# Patient Record
Sex: Female | Born: 1935 | Race: White | Hispanic: No | Marital: Married | Smoking: Never smoker
Health system: Southern US, Community
[De-identification: ages and names within clinical notes are randomized; demographics above are authoritative.]

## PROBLEM LIST (undated history)

## (undated) DIAGNOSIS — I2510 Atherosclerotic heart disease of native coronary artery without angina pectoris: Secondary | ICD-10-CM

## (undated) DIAGNOSIS — I4891 Unspecified atrial fibrillation: Secondary | ICD-10-CM

## (undated) DIAGNOSIS — E785 Hyperlipidemia, unspecified: Secondary | ICD-10-CM

## (undated) DIAGNOSIS — I441 Atrioventricular block, second degree: Secondary | ICD-10-CM

## (undated) DIAGNOSIS — I10 Essential (primary) hypertension: Secondary | ICD-10-CM

## (undated) DIAGNOSIS — M810 Age-related osteoporosis without current pathological fracture: Secondary | ICD-10-CM

## (undated) DIAGNOSIS — M1990 Unspecified osteoarthritis, unspecified site: Secondary | ICD-10-CM

## (undated) HISTORY — PX: BLADDER SUSPENSION: SHX72

## (undated) HISTORY — PX: CORONARY ARTERY BYPASS GRAFT: SHX141

## (undated) HISTORY — PX: CHOLECYSTECTOMY: SHX55

## (undated) HISTORY — PX: RECTAL PROLAPSE REPAIR: SHX759

---

## 1997-12-11 ENCOUNTER — Inpatient Hospital Stay (HOSPITAL_COMMUNITY): Admission: AD | Admit: 1997-12-11 | Discharge: 1997-12-19 | Payer: Self-pay | Admitting: *Deleted

## 2002-06-29 ENCOUNTER — Emergency Department (HOSPITAL_COMMUNITY): Admission: EM | Admit: 2002-06-29 | Discharge: 2002-06-29 | Payer: Self-pay | Admitting: Internal Medicine

## 2002-06-29 ENCOUNTER — Encounter: Payer: Self-pay | Admitting: Internal Medicine

## 2005-12-27 ENCOUNTER — Ambulatory Visit: Payer: Self-pay | Admitting: Cardiology

## 2013-04-07 ENCOUNTER — Inpatient Hospital Stay (HOSPITAL_COMMUNITY)
Admission: AD | Admit: 2013-04-07 | Discharge: 2013-04-10 | DRG: 243 | Disposition: A | Discharge status: Home / Self Care | Payer: Medicare Other | Source: Other Acute Inpatient Hospital | Attending: Cardiology | Admitting: Cardiology

## 2013-04-07 ENCOUNTER — Encounter (HOSPITAL_COMMUNITY): Payer: Self-pay | Admitting: Acute Care

## 2013-04-07 DIAGNOSIS — Z800 Family history of malignant neoplasm of digestive organs: Secondary | ICD-10-CM

## 2013-04-07 DIAGNOSIS — N179 Acute kidney failure, unspecified: Secondary | ICD-10-CM

## 2013-04-07 DIAGNOSIS — I25810 Atherosclerosis of coronary artery bypass graft(s) without angina pectoris: Secondary | ICD-10-CM

## 2013-04-07 DIAGNOSIS — R29898 Other symptoms and signs involving the musculoskeletal system: Secondary | ICD-10-CM | POA: Diagnosis not present

## 2013-04-07 DIAGNOSIS — Z7901 Long term (current) use of anticoagulants: Secondary | ICD-10-CM

## 2013-04-07 DIAGNOSIS — Z9089 Acquired absence of other organs: Secondary | ICD-10-CM

## 2013-04-07 DIAGNOSIS — I498 Other specified cardiac arrhythmias: Secondary | ICD-10-CM | POA: Diagnosis present

## 2013-04-07 DIAGNOSIS — I4891 Unspecified atrial fibrillation: Secondary | ICD-10-CM | POA: Diagnosis present

## 2013-04-07 DIAGNOSIS — D649 Anemia, unspecified: Secondary | ICD-10-CM

## 2013-04-07 DIAGNOSIS — R209 Unspecified disturbances of skin sensation: Secondary | ICD-10-CM | POA: Diagnosis not present

## 2013-04-07 DIAGNOSIS — Z823 Family history of stroke: Secondary | ICD-10-CM

## 2013-04-07 DIAGNOSIS — Z951 Presence of aortocoronary bypass graft: Secondary | ICD-10-CM

## 2013-04-07 DIAGNOSIS — I442 Atrioventricular block, complete: Principal | ICD-10-CM | POA: Diagnosis present

## 2013-04-07 DIAGNOSIS — Z602 Problems related to living alone: Secondary | ICD-10-CM

## 2013-04-07 DIAGNOSIS — I129 Hypertensive chronic kidney disease with stage 1 through stage 4 chronic kidney disease, or unspecified chronic kidney disease: Secondary | ICD-10-CM | POA: Diagnosis present

## 2013-04-07 DIAGNOSIS — D696 Thrombocytopenia, unspecified: Secondary | ICD-10-CM | POA: Diagnosis present

## 2013-04-07 DIAGNOSIS — I441 Atrioventricular block, second degree: Secondary | ICD-10-CM | POA: Insufficient documentation

## 2013-04-07 DIAGNOSIS — I10 Essential (primary) hypertension: Secondary | ICD-10-CM | POA: Diagnosis present

## 2013-04-07 DIAGNOSIS — Z833 Family history of diabetes mellitus: Secondary | ICD-10-CM

## 2013-04-07 DIAGNOSIS — M810 Age-related osteoporosis without current pathological fracture: Secondary | ICD-10-CM | POA: Diagnosis present

## 2013-04-07 DIAGNOSIS — Z79899 Other long term (current) drug therapy: Secondary | ICD-10-CM

## 2013-04-07 DIAGNOSIS — E875 Hyperkalemia: Secondary | ICD-10-CM | POA: Diagnosis present

## 2013-04-07 DIAGNOSIS — I4892 Unspecified atrial flutter: Secondary | ICD-10-CM | POA: Diagnosis present

## 2013-04-07 DIAGNOSIS — I2510 Atherosclerotic heart disease of native coronary artery without angina pectoris: Secondary | ICD-10-CM | POA: Diagnosis present

## 2013-04-07 DIAGNOSIS — N183 Chronic kidney disease, stage 3 unspecified: Secondary | ICD-10-CM

## 2013-04-07 DIAGNOSIS — E785 Hyperlipidemia, unspecified: Secondary | ICD-10-CM | POA: Diagnosis present

## 2013-04-07 DIAGNOSIS — H9190 Unspecified hearing loss, unspecified ear: Secondary | ICD-10-CM | POA: Diagnosis present

## 2013-04-07 DIAGNOSIS — M1990 Unspecified osteoarthritis, unspecified site: Secondary | ICD-10-CM | POA: Diagnosis present

## 2013-04-07 HISTORY — DX: Age-related osteoporosis without current pathological fracture: M81.0

## 2013-04-07 HISTORY — DX: Unspecified osteoarthritis, unspecified site: M19.90

## 2013-04-07 HISTORY — DX: Unspecified atrial fibrillation: I48.91

## 2013-04-07 HISTORY — DX: Atrioventricular block, second degree: I44.1

## 2013-04-07 HISTORY — DX: Essential (primary) hypertension: I10

## 2013-04-07 HISTORY — DX: Hyperlipidemia, unspecified: E78.5

## 2013-04-07 HISTORY — DX: Atherosclerotic heart disease of native coronary artery without angina pectoris: I25.10

## 2013-04-07 LAB — URINE MICROSCOPIC-ADD ON

## 2013-04-07 LAB — CBC
HCT: 31.1 % — ABNORMAL LOW (ref 36.0–46.0)
Hemoglobin: 11.2 g/dL — ABNORMAL LOW (ref 12.0–15.0)
MCH: 30.4 pg (ref 26.0–34.0)
MCHC: 36 g/dL (ref 30.0–36.0)
MCV: 84.3 fL (ref 78.0–100.0)
Platelets: 123 10*3/uL — ABNORMAL LOW (ref 150–400)
RDW: 13.4 % (ref 11.5–15.5)
WBC: 5.2 10*3/uL (ref 4.0–10.5)

## 2013-04-07 LAB — URINALYSIS, ROUTINE W REFLEX MICROSCOPIC
Bilirubin Urine: NEGATIVE
Hgb urine dipstick: NEGATIVE
Ketones, ur: NEGATIVE mg/dL
Nitrite: NEGATIVE
Specific Gravity, Urine: 1.008 (ref 1.005–1.030)
Urobilinogen, UA: 0.2 mg/dL (ref 0.0–1.0)
pH: 5 (ref 5.0–8.0)

## 2013-04-07 LAB — TROPONIN I: Troponin I: <0.3 ng/mL (ref <0.30)

## 2013-04-07 LAB — MRSA PCR SCREENING: MRSA by PCR: NEGATIVE

## 2013-04-07 MED ORDER — NITROGLYCERIN 0.4 MG SL SUBL: 0.4000 mg | SUBLINGUAL_TABLET | SUBLINGUAL | Status: DC | PRN

## 2013-04-07 MED ORDER — HEPARIN SODIUM (PORCINE) 5000 UNIT/ML IJ SOLN
5000.0000 [IU] | Freq: Three times a day (TID) | INTRAMUSCULAR | Status: DC
  Administered 2013-04-07 – 2013-04-08 (×3): 5000 [IU] via SUBCUTANEOUS
  Filled 2013-04-07 (×6): qty 1

## 2013-04-07 MED ORDER — AMLODIPINE BESYLATE 5 MG PO TABS
5.0000 mg | ORAL_TABLET | Freq: Every day | ORAL | Status: DC
  Administered 2013-04-08 – 2013-04-10 (×3): 5 mg via ORAL
  Filled 2013-04-07 (×3): qty 1

## 2013-04-07 MED ORDER — SODIUM CHLORIDE 0.9 % IV SOLN
INTRAVENOUS | Status: DC
  Administered 2013-04-07: 16:00:00 via INTRAVENOUS

## 2013-04-07 MED ORDER — ACETAMINOPHEN 325 MG PO TABS: 650.0000 mg | ORAL_TABLET | ORAL | Status: DC | PRN

## 2013-04-07 MED ORDER — ASPIRIN EC 81 MG PO TBEC
81.0000 mg | DELAYED_RELEASE_TABLET | Freq: Every day | ORAL | Status: DC
  Administered 2013-04-08 – 2013-04-09 (×2): 81 mg via ORAL
  Filled 2013-04-07 (×3): qty 1

## 2013-04-07 MED ORDER — ATROPINE SULFATE 1 MG/ML IJ SOLN
0.5000 mg | INTRAMUSCULAR | Status: DC | PRN
  Filled 2013-04-07: qty 0.5

## 2013-04-07 MED ORDER — HYDRALAZINE HCL 25 MG PO TABS
25.0000 mg | ORAL_TABLET | Freq: Three times a day (TID) | ORAL | Status: DC
  Administered 2013-04-07 – 2013-04-09 (×7): 25 mg via ORAL
  Filled 2013-04-07 (×11): qty 1

## 2013-04-07 MED ORDER — ONDANSETRON HCL 4 MG/2ML IJ SOLN: 4.0000 mg | Freq: Four times a day (QID) | INTRAMUSCULAR | Status: DC | PRN

## 2013-04-07 NOTE — Progress Notes (Signed)
Pt stated need to void, pt able to void 100cc in bedpan but did not feel as if she empted her bladder. Bladder scan revealed greater than 500. Nicolasa Ducking notified, new orders received. Will continue to monitor.

## 2013-04-07 NOTE — H&P (Signed)
Hickman ID: Brittany Hickman MRN: [id number], DOB/AGE: [birth year]   Admit date: 04/07/2013   Primary Physician: Selinda Flavin, MD - [facility] Primary Cardiologist: new to  seen by J. Othel Hoogendoorn, MD (pt lives in [place]).  Pt. Profile:  79 year old female with prior history of CAD status post coronary artery bypass graft in 1999 who presents on transfer from [facility] hospital secondary to symptomatic bradycardia and high-grade heart block.  Problem List  Past Medical History  Diagnosis Date  . CAD (coronary artery disease)     a. s/p 5 vessel CABG in 1999.  Marland Kitchen HTN (hypertension)   . Hyperlipidemia   . Osteoarthritis   . Osteoporosis     Past Surgical History  Procedure Laterality Date  . Cholecystectomy    . Bladder suspension    . Rectal prolapse repair       Allergies  No Known Allergies  HPI  79 year old female with prior history of coronary artery disease status post coronary artery bypass grafting in 1999. She says that following her bypass surgery, she did extremely well and was released from cardiology care at that time. She is followed closely by her primary care provider and is managed for what sounds to be somewhat difficult to control hypertension. She says that over Brittany past few months to one year, but she feels her health has been failing. She lives by herself in [place] and only gets out of her house to go to church. She mostly ambulates using a cane but over Brittany past 4-6 weeks, she has been too weak to use a cane and instead has been using a walker. She has daughters that live nearby and do her grocery shopping and housekeeping. He also take her to church. Approximately 4-6 weeks ago, Brittany Hickman began to feel weakness and difficulty ambulating. As noted above, she went from using a cane to using her walker. She also began to experience intermittent substernal chest discomfort occasionally associated with nausea and occasionally radiating through to her back, lasting a  few minutes and resolving spontaneously. Chest pain symptoms have typically been occurring at rest about once a week or slightly more. She also reports several episodes of feeling very lightheaded and dizzy associated with nausea and left-sided numbness. Despite experiencing these episodes, she has never lost consciousness. Early this morning, Hickman awoke with an urge to use Brittany toilet. When she tried to get out of bed, she felt paresthesias in her left upper extremity and lower extremity along with numbness on Brittany left side. She sat at Brittany edge of her bed for a short while and then was able to reach for her walker and ambulated to Brittany bathroom saying that she dragged her left leg behind her. When she came back to Brittany bed, she tried to fall back to sleep but because of ongoing left sided numbness, nausea, and lightheadedness, she was not able to fall back to sleep. At about 5 AM, she called her daughter and she was subsequently taken to [facility] ED. There, she was found to have high-grade heart block with Mobitz 2 and intermittent complete heart block. Labs revealed mild hyperkalemia as well as mild renal insufficiency with a normal troponin. She was transferred to [facility] cone for further evaluation and for consideration of pacemaker placement. Currently, she is symptom free though her heart rate is hovering in Brittany low 30s with 2 to one heart block. Blood pressures have been in Brittany 180s.  Home Medications   metoprolol 75  mg twice a day   amlodipine 10 mg twice a day HCTZ 25 mg daily Raloxifene 60 mg daily Benicar 40 mg daily Calcium carbonate 600 mg daily Centrum Silver 1 tab daily  Family History  Family History  Problem Relation Age of Onset  . Gastric cancer Father   . Stroke Mother   . Diabetes Sister    Social History  History   Social History  . Marital Status: Married    Spouse Name: N/A    Number of Children: N/A  . Years of Education: N/A   Occupational History    . Not on file.   Social History Main Topics  . Smoking status: Never Smoker   . Smokeless tobacco: Not on file  . Alcohol Use: No  . Drug Use: No  . Sexual Activity: Not on file   Other Topics Concern  . Not on file   Social History Narrative   Hickman lives in [place] by herself.  She has daughters nearby that are very involved and do her grocery shopping and bring her to church.    Review of Systems General:  +++ generalized malaise and weakness.  No chills, fever, night sweats or weight changes.  Cardiovascular:  +++ chest pain, +++ dyspnea on exertion, edema, orthopnea, palpitations, paroxysmal nocturnal dyspnea. Dermatological: No rash, lesions/masses Respiratory: No cough, +++ dyspnea Urologic: No hematuria, dysuria Abdominal:   +++ nausea associated with chest pain and/or lightheadedness.  No vomiting, diarrhea, bright red blood per rectum, melena, or hematemesis Neurologic: +++ left sided numbness and paresthesias as well as L>R sided wkns.  No visual changes, changes in mental status. All other systems reviewed and are otherwise negative except as noted above.  Physical Exam  Blood pressure 182/56, pulse 37, temperature 98.8 F (37.1 C), temperature source Oral, resp. rate 10, height 5\' 4"  (1.626 m), weight 117 lb 11.6 oz (53.4 kg), SpO2 97.00%.  General: Pleasant, NAD Psych: Normal affect. Neuro: Alert and oriented X 3. Moves all extremities spontaneously. HEENT: Very HOH.  OTW nl.  Neck: Supple without bruits or JVD. Lungs:  Resp regular and unlabored, CTA. Heart: bradycardic, regular, distant, no s3, s4, or murmurs. Abdomen: Soft, non-tender, non-distended, BS + x 4.  Extremities: No clubbing, cyanosis or edema. DP/PT/Radials 1+ and equal bilaterally.  Labs  Hemoglobin 11.3, hematocrit 33.1, WBC 5.1, platelets 123 INR 1.1 Sodium 131, potassium 5.2, chloride 102 CO2 21, BUN 34, creatinine 1.76, glucose 185 Troponin 0.01, CK 23, MB 1.4    Radiology/Studies  No results found.  ECG  2:1 HB with ventricular escape, 30, lbbb morphology  ASSESSMENT AND PLAN  1.  2:1 Heart Block with intermittent CHB:  Pt presents with a several week h/o progressive weakness and intermittent lightheadedness, nausea, and chest pain.  This AM, she awoke with left sided wkns and was found to be profoundly bradycardic with high grade HB including intermittent mobitz II and CHB with AV dissociation.  Despite this, she is currently hemodynamically stable with BP's in Brittany 180's and HR's in Brittany 30's to 40's.  She is on lopressor 75mg  bid @ home and did take a dose this AM.  She has also been found to have mile renal insuff (creat 1.76) and hyperkalemia.  We will hold bb, gently hydrate, repeat bmet (correct K+ if higher), hold ARB/diuretic.  Zoll @ bedside and pads in place.  Atropine @ bedside.  We will have EP see in Brittany AM for consideration of PPM.    2.  CAD:  S/p CABG in 1999.  She has had intermittent retrosternal chest pressure radiating through to her back and associated with nausea.  She is s/p CABG in 1999 and has not had evaluation since.  We will check an echo - if LV fxn is nl and troponin remains negative, we forego cath, in light of renal insufficiency - family is in agreement.  Hold BB.  3.  HTN:  BP currently elevated.  We will be holding ARB, diuretic, and BB.  Add hydralazine for bp mgmt.  4.  CKD:  Told about a month ago that kidney function is mildly abnormal renal function.  As above, will hold diuretic and ARB.  Gently hydrate today and f/u bmet in AM.  Ideally, would like to avoid contrast.  5.  Left sided wkns/paresthesias:  In setting of #1.  Head CT @ [facility] reportedly non-acute.  No neurologic deficits on exam.  Signed, Nicolasa Ducking, NP 04/07/2013, 3:30 PM  History and all data above reviewed.  Hickman examined.  I agree with Brittany findings as above.  Brittany Hickman is very lovely but very hard of hearing.  She has had some  atypical/typical pain.  This could represent unstable angina but it is very difficult to assess.  She also has symptoms of intermittent left sided weakness/tingling but no objective evidence of focal neurologic deficit.  She presents with weakness and is found to have bradycardia.  Close review of Brittany available strips indicates some intermittent CHB.  However, she does take beta blockers.  Compounding her situation she has HTN that apparently has been very difficult to control.  She is a very viable/functional 90.  She does have CKD.   Brittany Hickman exam reveals COR:RRR  ,  Lungs: Clear  ,  Abd: Positive bowel sounds, no rebound no guarding, Ext No edema.  All available labs, radiology testing, previous records reviewed. Agree with documented assessment and plan. Bradycardia:  She has intermittent heart block.  She does have weakness and some vague neurologic complaints that are quite possibly related to this.  I would hold Brittany beta blocker today and we will have EP see her in Brittany AM.  I think she will likely need a pacemaker prior to discharge.  In part Brittany reason will be that we will likely need negative chronotropic agents for BP control in Brittany future.  CAD:  We will cycle enzymes and check an echocardiogram.  I have discussed conservative therapy for this with her family and they agree.  I would have a very high threshold for invasive or even noninvasive testing.  HTN:  For now we can use a low dose of Norvasc, add hydralazine and nitrates and follow her BP.   Fayrene Fearing Tata Timmins  4:05 PM  04/07/2013

## 2013-04-07 NOTE — Progress Notes (Signed)
Foley cath placed due to inability to fully empty bladder. Clear yellow urine returned upon placement. Pt tolerated procedure well. Will continue to monitor.

## 2013-04-08 ENCOUNTER — Encounter (HOSPITAL_COMMUNITY)
Admission: AD | Disposition: A | Discharge status: Home / Self Care | Payer: Self-pay | Source: Other Acute Inpatient Hospital | Attending: Cardiology

## 2013-04-08 DIAGNOSIS — N179 Acute kidney failure, unspecified: Secondary | ICD-10-CM

## 2013-04-08 DIAGNOSIS — I25810 Atherosclerosis of coronary artery bypass graft(s) without angina pectoris: Secondary | ICD-10-CM

## 2013-04-08 DIAGNOSIS — I10 Essential (primary) hypertension: Secondary | ICD-10-CM

## 2013-04-08 DIAGNOSIS — I441 Atrioventricular block, second degree: Secondary | ICD-10-CM

## 2013-04-08 DIAGNOSIS — I442 Atrioventricular block, complete: Principal | ICD-10-CM

## 2013-04-08 DIAGNOSIS — I369 Nonrheumatic tricuspid valve disorder, unspecified: Secondary | ICD-10-CM

## 2013-04-08 DIAGNOSIS — D649 Anemia, unspecified: Secondary | ICD-10-CM

## 2013-04-08 HISTORY — PX: PACEMAKER INSERTION: SHX728

## 2013-04-08 HISTORY — PX: PERMANENT PACEMAKER INSERTION: SHX5480

## 2013-04-08 LAB — BASIC METABOLIC PANEL
BUN: 34 mg/dL — ABNORMAL HIGH (ref 6–23)
CO2: 18 mEq/L — ABNORMAL LOW (ref 19–32)
Calcium: 8.5 mg/dL (ref 8.4–10.5)
Chloride: 101 mEq/L (ref 96–112)
Creatinine, Ser: 1.74 mg/dL — ABNORMAL HIGH (ref 0.50–1.10)
GFR calc non Af Amer: 25 mL/min — ABNORMAL LOW (ref >90)
Glucose, Bld: 82 mg/dL (ref 70–99)
Sodium: 132 mEq/L — ABNORMAL LOW (ref 135–145)

## 2013-04-08 LAB — CBC
HCT: 27.1 % — ABNORMAL LOW (ref 36.0–46.0)
Hemoglobin: 9.5 g/dL — ABNORMAL LOW (ref 12.0–15.0)
MCH: 30.4 pg (ref 26.0–34.0)
MCHC: 35.1 g/dL (ref 30.0–36.0)
MCV: 86.9 fL (ref 78.0–100.0)
Platelets: 71 10*3/uL — ABNORMAL LOW (ref 150–400)
RBC: 3.12 MIL/uL — ABNORMAL LOW (ref 3.87–5.11)

## 2013-04-08 LAB — LIPID PANEL
Cholesterol: 105 mg/dL (ref 0–200)
HDL: 22 mg/dL — ABNORMAL LOW (ref >39)
LDL Cholesterol: 60 mg/dL (ref 0–99)
Total CHOL/HDL Ratio: 4.8 RATIO
Triglycerides: 116 mg/dL (ref <150)
VLDL: 23 mg/dL (ref 0–40)

## 2013-04-08 LAB — TROPONIN I: Troponin I: <0.3 ng/mL (ref <0.30)

## 2013-04-08 SURGERY — PERMANENT PACEMAKER INSERTION
Anesthesia: LOCAL

## 2013-04-08 MED ORDER — SODIUM CHLORIDE 0.9 % IR SOLN
80.0000 mg | Status: DC
  Filled 2013-04-08: qty 2

## 2013-04-08 MED ORDER — HYDROCODONE-ACETAMINOPHEN 5-325 MG PO TABS: 1.0000 | ORAL_TABLET | ORAL | Status: DC | PRN

## 2013-04-08 MED ORDER — HEPARIN (PORCINE) IN NACL 2-0.9 UNIT/ML-% IJ SOLN
INTRAMUSCULAR | Status: AC
  Filled 2013-04-08: qty 500

## 2013-04-08 MED ORDER — CEFAZOLIN SODIUM-DEXTROSE 2-3 GM-% IV SOLR
2.0000 g | INTRAVENOUS | Status: DC
  Filled 2013-04-08: qty 50

## 2013-04-08 MED ORDER — SODIUM CHLORIDE 0.45 % IV SOLN
INTRAVENOUS | Status: DC
  Administered 2013-04-08: 10 mL via INTRAVENOUS

## 2013-04-08 MED ORDER — ONDANSETRON HCL 4 MG/2ML IJ SOLN: 4.0000 mg | Freq: Four times a day (QID) | INTRAMUSCULAR | Status: DC | PRN

## 2013-04-08 MED ORDER — CEFAZOLIN SODIUM 1-5 GM-% IV SOLN
1.0000 g | Freq: Four times a day (QID) | INTRAVENOUS | Status: DC
  Administered 2013-04-09: 1 g via INTRAVENOUS
  Filled 2013-04-08 (×3): qty 50

## 2013-04-08 MED ORDER — ACETAMINOPHEN 325 MG PO TABS
325.0000 mg | ORAL_TABLET | ORAL | Status: DC | PRN
  Administered 2013-04-09: 325 mg via ORAL
  Filled 2013-04-08: qty 2

## 2013-04-08 MED ORDER — MIDAZOLAM HCL 5 MG/5ML IJ SOLN
INTRAMUSCULAR | Status: AC
  Filled 2013-04-08: qty 5

## 2013-04-08 MED ORDER — FENTANYL CITRATE 0.05 MG/ML IJ SOLN
INTRAMUSCULAR | Status: AC
  Filled 2013-04-08: qty 2

## 2013-04-08 MED ORDER — LIDOCAINE HCL (PF) 1 % IJ SOLN
INTRAMUSCULAR | Status: AC
  Filled 2013-04-08: qty 60

## 2013-04-08 MED ORDER — CHLORHEXIDINE GLUCONATE 4 % EX LIQD
60.0000 mL | Freq: Once | CUTANEOUS | Status: AC
  Administered 2013-04-08: 4 via TOPICAL
  Filled 2013-04-08: qty 60

## 2013-04-08 MED ORDER — SODIUM CHLORIDE 0.9 % IV SOLN
INTRAVENOUS | Status: DC
  Administered 2013-04-08: 10:00:00 via INTRAVENOUS

## 2013-04-08 MED ORDER — SODIUM CHLORIDE 0.9 % IV SOLN
INTRAVENOUS | Status: DC
  Administered 2013-04-08 – 2013-04-09 (×2): via INTRAVENOUS

## 2013-04-08 MED ORDER — CEFAZOLIN SODIUM 1-5 GM-% IV SOLN
1.0000 g | Freq: Four times a day (QID) | INTRAVENOUS | Status: AC
  Administered 2013-04-08 – 2013-04-09 (×2): 1 g via INTRAVENOUS
  Filled 2013-04-08 (×2): qty 50

## 2013-04-08 NOTE — Progress Notes (Signed)
Utilization review completed.  

## 2013-04-08 NOTE — Progress Notes (Signed)
  Echocardiogram 2D Echocardiogram has been performed.  Jorje Guild 04/08/2013, 12:15 PM

## 2013-04-08 NOTE — Op Note (Signed)
SURGEON:  Hillis Range, MD     PREPROCEDURE DIAGNOSIS:  Symptomatic mobitz II second degree AV block    POSTPROCEDURE DIAGNOSIS:  Symptomatic mobitz II second degree AV block, atrial flutter     PROCEDURES:   1.  Pacemaker implantation.     INTRODUCTION: Brittany Hickman is a 79 y.o. female  with a history of symptomatic mobitz II second degree AV block who presents today for pacemaker implantation.  The patient reports intermittent episodes of fatigue and dizziness over the past few days.  Her beta blocker was discontinued without resolution.  No reversible causes have been identified.  The patient therefore presents today for pacemaker implantation.     DESCRIPTION OF PROCEDURE:  Informed written consent was obtained, and the patient was brought to the electrophysiology lab in a fasting state.  The patient required no sedation for the procedure today.  The patients left chest was prepped and draped in the usual sterile fashion by the EP lab staff. The skin overlying the left deltopectoral region was infiltrated with lidocaine for local analgesia.  A 4-cm incision was made over the left deltopectoral region.  A left subcutaneous pacemaker pocket was fashioned using a combination of sharp and blunt dissection. Electrocautery was required to assure hemostasis.    RA/RV Lead Placement: The left axillary vein was cannulated.  No contrast was required for the procedure today.  Through the left axillary vein, a Medtronic model [id number] (serial number [id number]) right atrial lead and a Medtronic model 5092- 58 (serial number [id number]) right ventricular lead were advanced   with fluoroscopic visualization into the right atrial appendage and right ventricular apex positions respectively. She presented in atrial flutter today. Initial atrial flutter waves measured 0.8-1.3 mV with impedance of 693 ohms.  Right ventricular lead R-waves measured 14 mV with an impedance of 634 ohms and a threshold of 0.3 V at  0.5 msec.  Both leads were secured to the pectoralis fascia using #2-0 silk over the suture sleeves.   Device Placement:  The leads were then connected to a Medtronic [place] model SEDR0 1 (serial number [id number]) pacemaker.  The pocket was irrigated with copious gentamicin solution.  The pacemaker was then placed into the pocket.  The pocket was then closed in 2 layers with 2.0 Vicryl suture for the subcutaneous and subcuticular layers.  Steri- Strips and a sterile dressing were then applied.  There were no early apparent complications.  No contrast was required for the procedure today.     CONCLUSIONS:   1. Successful implantation of a Medtronic Sensia dual-chamber pacemaker for symptomatic mobitz II AV block  2. No early apparent complications.           Hillis Range, MD 04/08/2013 5:42 PM

## 2013-04-08 NOTE — Progress Notes (Signed)
RFA IV site very swollen, IVF turned off, one attempt by this RN unsuccessful to insert IV.  IV team RN called and IV placed to Rt upper arm.   Pt had 5 beats vtach, RN talking with pt, A&Ox4, asymptomatic and unaware.  No BP to Rt upper arm due to IV.  No BP left arm due to LUC PM site.. BP Rt lower leg 65/24, BP left lower leg 95/55.  BP Rt forearm 119/58.  Tele V-Paced 60's w/ freq PVC's.  Leaving Foley in place due to V-tach and documented urine retention earlier.  Discussed code status with pt who states she is a full code but does not want "to be hooked up to machines for a long time if you can't fix me".  Pt states daughter [name] is POA for healthcare and Velna Hatchet is to make decisions if [name] can't.  States has legal paperwork in safe deposit box and daughters will bring in to place on chart.  Dozing quietly, resp unlabored, 96% on RA.  SCD's placed per MD order.

## 2013-04-08 NOTE — Consult Note (Signed)
 ELECTROPHYSIOLOGY CONSULT NOTE    Patient ID: Brittany Hickman MRN: [id number], DOB/AGE: 12/07/1920 79 y.o.  Admit date: 04/07/2013 Date of Consult: 04-08-2013  Primary Physician: HOWARD, KEVIN, MD Primary Cardiologist: new to [facility]  Reason for Consultation: heart block  HPI:  Brittany Hickman is a 79 year old female with a past medical history significant for coronary artery disease (s/p CABG 1999), hypertension, hyperlipidemia, and osteoarthritis.  She has been followed by her PCP for her hypertension and has not had cardiology follow up recently.  A few weeks ago, she began to have progressive weakness and dizziness.  She was evaluated at [facility] yesterday and was found have high grade heart block with ventricular rates in the 30's.  She was transferred to [facility] for further evaluation.    She denies chest pain, shortness of breath, or frank syncope.  Her Lopressor has been held (last dose yesterday morning) and she has been placed on Hydralazine for blood pressure control.  EP has been asked to evaluate for treatment options.  Echo is pending this admission.   ROS is negative except as outlined above.   Past Medical History  Diagnosis Date  . CAD (coronary artery disease)     a. s/p 5 vessel CABG in 1999.  . HTN (hypertension)   . Hyperlipidemia   . Osteoarthritis   . Osteoporosis      Surgical History:  Past Surgical History  Procedure Laterality Date  . Cholecystectomy    . Bladder suspension    . Rectal prolapse repair       Prescriptions prior to admission  Medication Sig Dispense Refill  . amLODipine (NORVASC) 10 MG tablet Take 10 mg by mouth daily.      . aspirin EC 81 MG tablet Take 81 mg by mouth daily.      . CALCIUM PO Take 1 tablet by mouth daily.      . estradiol (ESTRACE) 0.1 MG/GM vaginal cream Place 1 Applicatorful vaginally once a week.      . hydrochlorothiazide (HYDRODIURIL) 25 MG tablet Take 25 mg by mouth daily.      . metoprolol  (LOPRESSOR) 50 MG tablet Take 50 mg by mouth 2 (two) times daily.      . Multiple Vitamins-Minerals (MULTIVITAMIN PO) Take 1 tablet by mouth daily.      . olmesartan (BENICAR) 40 MG tablet Take 40 mg by mouth daily.      . raloxifene (EVISTA) 60 MG tablet Take 60 mg by mouth daily.        Inpatient Medications:  . amLODipine  5 mg Oral Daily  . aspirin EC  81 mg Oral Daily  . heparin  5,000 Units Subcutaneous Q8H  . hydrALAZINE  25 mg Oral Q8H    Allergies: No Known Allergies  History   Social History  . Marital Status: Married    Spouse Name: N/A    Number of Children: N/A  . Years of Education: N/A   Occupational History  . Not on file.   Social History Main Topics  . Smoking status: Never Smoker   . Smokeless tobacco: Not on file  . Alcohol Use: No  . Drug Use: No  . Sexual Activity: Not on file   Other Topics Concern  . Not on file   Social History Narrative   Patient lives in [place] by herself.  She has daughters nearby that are very involved and do her grocery shopping and bring her to church.       Family History  Problem Relation Age of Onset  . Gastric cancer Father   . Stroke Mother   . Diabetes Sister     Physical Exam: Filed Vitals:   04/07/13 1627 04/07/13 2000 04/08/13 0055 04/08/13 0418  BP: 172/57 163/39 136/55 124/39  Pulse: 32 34 34 35  Temp: 98 F (36.7 C) 97.7 F (36.5 C) 97.6 F (36.4 C) 98 F (36.7 C)  TempSrc: Oral Oral Oral Oral  Resp: 26 19 20 17  Height:      Weight:      SpO2: 100% 100% 100% 99%    GEN- The patient is thin and elderly appearing, alert and oriented x 3 today.   Head- normocephalic, atraumatic Eyes-  Sclera clear, conjunctiva pink Ears- hearing significantly decreased, hearing aids in place Oropharynx- clear Neck- supple, no JVP Lymph- no cervical lymphadenopathy Lungs- Clear to ausculation bilaterally, normal work of breathing Heart-  Irregular bradycardic rhythm GI- soft, NT, ND, + BS Extremities-  no clubbing, cyanosis, or edema MS- age appropriate muscle atrophy Skin- no rash or lesion Psych- euthymic mood, full affect Neuro- strength and sensation are intact  Labs:   Lab Results  Component Value Date   WBC 4.6 04/08/2013   HGB 9.5* 04/08/2013   HCT 27.1* 04/08/2013   MCV 86.9 04/08/2013   PLT 71* 04/08/2013    Recent Labs Lab 04/08/13 0410  NA 132*  K 4.5  CL 101  CO2 18*  BUN 34*  CREATININE 1.74*  CALCIUM 8.5  GLUCOSE 82   Lab Results  Component Value Date   TROPONINI <0.30 04/07/2013   Lab Results  Component Value Date   CHOL 105 04/08/2013   Lab Results  Component Value Date   HDL 22* 04/08/2013   Lab Results  Component Value Date   LDLCALC 60 04/08/2013   Lab Results  Component Value Date   TRIG 116 04/08/2013   Lab Results  Component Value Date   CHOLHDL 4.8 04/08/2013   No results found for this basename: LDLDIRECT    No results found for this basename: DDIMER     EKG: sinus rhythm with 2nd degree AV block, ventricular rate 38, QRS 124  TELEMETRY: high grade heart block with occasional 1:1 conduction   Assessment and Plan:  1. Mobitz II second degree AV block The patient has persistent mobitz II second degree AV block for which she is quite symptomatic.  Her metoprolol has been held > 24 hours without improvement.  I would therefore recommend pacemaker implantation at this time.  Risks, benefits, alternatives to pacemaker implantation were discussed in detail with the patient today. The patient understands that the risks include but are not limited to bleeding, infection, pneumothorax, perforation, tamponade, vascular damage, renal failure, MI, stroke, death,  and lead dislodgement and wishes to proceed. We will therefore schedule the procedure at the next available time.  2.  HTN Would not aggressively control BP until after PPM is in place  3. CAD Stable No change required today  4. Acute renal failure Will gently hydrate prior to  PPM Possibly due to poor renal perfusion  5.anemia/ thrombocytopenia Will follow Outpatient workup to be considered 

## 2013-04-08 NOTE — Progress Notes (Signed)
Pt transferred to cath lab. Family aware of transfer.

## 2013-04-08 NOTE — Interval H&P Note (Signed)
History and Physical Interval Note:  04/08/2013 1:53 PM  [place]  has presented today for surgery, with the diagnosis of snycope  The various methods of treatment have been discussed with the patient and family. After consideration of risks, benefits and other options for treatment, the patient has consented to  Procedure(s): PERMANENT PACEMAKER INSERTION (N/A) as a surgical intervention .  The patient's history has been reviewed, patient examined, no change in status, stable for surgery.  I have reviewed the patient's chart and labs.  Questions were answered to the patient's satisfaction.     Hillis Range

## 2013-04-08 NOTE — H&P (View-Only) (Signed)
ELECTROPHYSIOLOGY CONSULT NOTE    Patient ID: Brittany Hickman MRN: [id number], DOB/AGE: 12/13/1920 79 y.o.  Admit date: 04/07/2013 Date of Consult: 04-08-2013  Primary Physician: Selinda Flavin, MD Primary Cardiologist: new to [facility]  Reason for Consultation: heart block  HPI:  Brittany Hickman is a 79 year old female with a past medical history significant for coronary artery disease (s/p CABG 1999), hypertension, hyperlipidemia, and osteoarthritis.  She has been followed by her PCP for her hypertension and has not had cardiology follow up recently.  A few weeks ago, she began to have progressive weakness and dizziness.  She was evaluated at [facility] yesterday and was found have high grade heart block with ventricular rates in the 30's.  She was transferred to [facility] for further evaluation.    She denies chest pain, shortness of breath, or frank syncope.  Her Lopressor has been held (last dose yesterday morning) and she has been placed on Hydralazine for blood pressure control.  EP has been asked to evaluate for treatment options.  Echo is pending this admission.   ROS is negative except as outlined above.   Past Medical History  Diagnosis Date  . CAD (coronary artery disease)     a. s/p 5 vessel CABG in 1999.  Marland Kitchen HTN (hypertension)   . Hyperlipidemia   . Osteoarthritis   . Osteoporosis      Surgical History:  Past Surgical History  Procedure Laterality Date  . Cholecystectomy    . Bladder suspension    . Rectal prolapse repair       Prescriptions prior to admission  Medication Sig Dispense Refill  . amLODipine (NORVASC) 10 MG tablet Take 10 mg by mouth daily.      Marland Kitchen aspirin EC 81 MG tablet Take 81 mg by mouth daily.      Marland Kitchen CALCIUM PO Take 1 tablet by mouth daily.      Marland Kitchen estradiol (ESTRACE) 0.1 MG/GM vaginal cream Place 1 Applicatorful vaginally once a week.      . hydrochlorothiazide (HYDRODIURIL) 25 MG tablet Take 25 mg by mouth daily.      . metoprolol  (LOPRESSOR) 50 MG tablet Take 50 mg by mouth 2 (two) times daily.      . Multiple Vitamins-Minerals (MULTIVITAMIN PO) Take 1 tablet by mouth daily.      Marland Kitchen olmesartan (BENICAR) 40 MG tablet Take 40 mg by mouth daily.      . raloxifene (EVISTA) 60 MG tablet Take 60 mg by mouth daily.        Inpatient Medications:  . amLODipine  5 mg Oral Daily  . aspirin EC  81 mg Oral Daily  . heparin  5,000 Units Subcutaneous Q8H  . hydrALAZINE  25 mg Oral Q8H    Allergies: No Known Allergies  History   Social History  . Marital Status: Married    Spouse Name: N/A    Number of Children: N/A  . Years of Education: N/A   Occupational History  . Not on file.   Social History Main Topics  . Smoking status: Never Smoker   . Smokeless tobacco: Not on file  . Alcohol Use: No  . Drug Use: No  . Sexual Activity: Not on file   Other Topics Concern  . Not on file   Social History Narrative   Patient lives in [place] by herself.  She has daughters nearby that are very involved and do her grocery shopping and bring her to church.  Family History  Problem Relation Age of Onset  . Gastric cancer Father   . Stroke Mother   . Diabetes Sister     Physical Exam: Filed Vitals:   04/07/13 1627 04/07/13 2000 04/08/13 0055 04/08/13 0418  BP: 172/57 163/39 136/55 124/39  Pulse: 32 34 34 35  Temp: 98 F (36.7 C) 97.7 F (36.5 C) 97.6 F (36.4 C) 98 F (36.7 C)  TempSrc: Oral Oral Oral Oral  Resp: 26 19 20 17   Height:      Weight:      SpO2: 100% 100% 100% 99%    GEN- The patient is thin and elderly appearing, alert and oriented x 3 today.   Head- normocephalic, atraumatic Eyes-  Sclera clear, conjunctiva pink Ears- hearing significantly decreased, hearing aids in place Oropharynx- clear Neck- supple, no JVP Lymph- no cervical lymphadenopathy Lungs- Clear to ausculation bilaterally, normal work of breathing Heart-  Irregular bradycardic rhythm GI- soft, NT, ND, + BS Extremities-  no clubbing, cyanosis, or edema MS- age appropriate muscle atrophy Skin- no rash or lesion Psych- euthymic mood, full affect Neuro- strength and sensation are intact  Labs:   Lab Results  Component Value Date   WBC 4.6 04/08/2013   HGB 9.5* 04/08/2013   HCT 27.1* 04/08/2013   MCV 86.9 04/08/2013   PLT 71* 04/08/2013    Recent Labs Lab 04/08/13 0410  NA 132*  K 4.5  CL 101  CO2 18*  BUN 34*  CREATININE 1.74*  CALCIUM 8.5  GLUCOSE 82   Lab Results  Component Value Date   TROPONINI <0.30 04/07/2013   Lab Results  Component Value Date   CHOL 105 04/08/2013   Lab Results  Component Value Date   HDL 22* 04/08/2013   Lab Results  Component Value Date   LDLCALC 60 04/08/2013   Lab Results  Component Value Date   TRIG 116 04/08/2013   Lab Results  Component Value Date   CHOLHDL 4.8 04/08/2013   No results found for this basename: LDLDIRECT    No results found for this basename: DDIMER     EKG: sinus rhythm with 2nd degree AV block, ventricular rate 38, QRS 124  TELEMETRY: high grade heart block with occasional 1:1 conduction   Assessment and Plan:  1. Mobitz II second degree AV block The patient has persistent mobitz II second degree AV block for which she is quite symptomatic.  Her metoprolol has been held > 24 hours without improvement.  I would therefore recommend pacemaker implantation at this time.  Risks, benefits, alternatives to pacemaker implantation were discussed in detail with the patient today. The patient understands that the risks include but are not limited to bleeding, infection, pneumothorax, perforation, tamponade, vascular damage, renal failure, MI, stroke, death,  and lead dislodgement and wishes to proceed. We will therefore schedule the procedure at the next available time.  2.  HTN Would not aggressively control BP until after PPM is in place  3. CAD Stable No change required today  4. Acute renal failure Will gently hydrate prior to  PPM Possibly due to poor renal perfusion  5.anemia/ thrombocytopenia Will follow Outpatient workup to be considered

## 2013-04-08 NOTE — Progress Notes (Signed)
Dr Jon Billings (card on call) notified of V-tach.

## 2013-04-09 ENCOUNTER — Encounter (HOSPITAL_COMMUNITY): Payer: Self-pay | Admitting: *Deleted

## 2013-04-09 ENCOUNTER — Inpatient Hospital Stay (HOSPITAL_COMMUNITY): Payer: Medicare Other

## 2013-04-09 LAB — CBC
Hemoglobin: 10 g/dL — ABNORMAL LOW (ref 12.0–15.0)
MCH: 30.3 pg (ref 26.0–34.0)
MCV: 86.4 fL (ref 78.0–100.0)
Platelets: 104 10*3/uL — ABNORMAL LOW (ref 150–400)
RBC: 3.3 MIL/uL — ABNORMAL LOW (ref 3.87–5.11)

## 2013-04-09 LAB — BASIC METABOLIC PANEL
CO2: 13 mEq/L — ABNORMAL LOW (ref 19–32)
Calcium: 8.4 mg/dL (ref 8.4–10.5)
GFR calc Af Amer: 34 mL/min — ABNORMAL LOW (ref >90)
Glucose, Bld: 98 mg/dL (ref 70–99)
Sodium: 132 mEq/L — ABNORMAL LOW (ref 135–145)

## 2013-04-09 LAB — MAGNESIUM: Magnesium: 2 mg/dL (ref 1.5–2.5)

## 2013-04-09 MED ORDER — HYDRALAZINE HCL 50 MG PO TABS
50.0000 mg | ORAL_TABLET | Freq: Three times a day (TID) | ORAL | Status: DC
  Administered 2013-04-09 – 2013-04-10 (×3): 50 mg via ORAL
  Filled 2013-04-09 (×5): qty 1

## 2013-04-09 MED ORDER — METOPROLOL TARTRATE 1 MG/ML IV SOLN
INTRAVENOUS | Status: AC
  Administered 2013-04-09: 5 mg via INTRAVENOUS
  Filled 2013-04-09: qty 5

## 2013-04-09 MED ORDER — IBUTILIDE FUMARATE 1 MG/10ML IV SOLN
0.0100 mg/kg | Freq: Once | INTRAVENOUS | Status: AC
  Administered 2013-04-09: 0.6 mg via INTRAVENOUS
  Filled 2013-04-09: qty 6

## 2013-04-09 MED ORDER — METOPROLOL TARTRATE 1 MG/ML IV SOLN
5.0000 mg | INTRAVENOUS | Status: AC
  Administered 2013-04-09: 5 mg via INTRAVENOUS

## 2013-04-09 MED ORDER — YOU HAVE A PACEMAKER BOOK
Freq: Once | Status: AC
  Administered 2013-04-09: 07:00:00
  Filled 2013-04-09: qty 1

## 2013-04-09 MED ORDER — HYDRALAZINE HCL 20 MG/ML IJ SOLN
10.0000 mg | INTRAMUSCULAR | Status: DC | PRN
  Administered 2013-04-09: 17:00:00 10 mg via INTRAVENOUS

## 2013-04-09 MED ORDER — HYDRALAZINE HCL 20 MG/ML IJ SOLN
INTRAMUSCULAR | Status: AC
Start: 2013-04-09 — End: 2013-04-10
  Filled 2013-04-09: qty 1

## 2013-04-09 NOTE — Progress Notes (Signed)
Patient ID: Brittany Hickman, female   DOB: [birth year], 79 y.o.   MRN: [id number]  EP Followup  We have given patient IV Ibutilide 0.6 mg over 10 minutes and she has returned to NSR. We have also given IV lopressor. She tolerated nicely and will plan to ambulate and increase activity today with plans to discharge home in the morning. Discussed with her family.  Leonia Reeves.D.

## 2013-04-09 NOTE — Progress Notes (Signed)
Pt dozing off and on, occas talking in sleep, awakened anxious and c/o nausea, SOB, and back pain.  BP 184/56, tele v-paced 60, O2 sat 95% on RA.  LUC site D&I, no swelling. Repositioned for comfort onto side, much reassurance provided, RN stayed at side for support.  Pt calmed and back to sleep.

## 2013-04-09 NOTE — Progress Notes (Signed)
   SUBJECTIVE: The patient is doing well today.  She complains of dizziness this morning which she says is normal for her. At this time, she denies chest pain, shortness of breath, or any other new concerns.  Labs pending this morning.   CURRENT MEDICATIONS: . amLODipine  5 mg Oral Daily  . aspirin EC  81 mg Oral Daily  .  ceFAZolin (ANCEF) IV  1 g Intravenous Q6H  . hydrALAZINE  25 mg Oral Q8H  . ibutilide (CORVERT) IV bolus (< 60 kg) Cardioversion  0.01 mg/kg Intravenous Once  . you have a pacemaker book   Does not apply Once   . sodium chloride 50 mL/hr at 04/08/13 2352    OBJECTIVE: Physical Exam: Filed Vitals:   04/08/13 2200 04/08/13 2350 04/09/13 0342 04/09/13 0541  BP:  161/42 184/56 180/48  Pulse:  60 59 60  Temp:  98.1 F (36.7 C) 97.7 F (36.5 C)   TempSrc:  Oral Oral   Resp:  18 18   Height:      Weight: 121 lb 7.6 oz (55.1 kg)     SpO2:  96% 95% 95%    Intake/Output Summary (Last 24 hours) at 04/09/13 0604 Last data filed at 04/09/13 0540  Gross per 24 hour  Intake   1465 ml  Output   1225 ml  Net    240 ml    Telemetry reveals atrial fibrillation with occasional ventricular pacing, occasional PVC's  GEN- The patient is well appearing, alert and oriented x 3 today.   Neck- supple, no JVP Lungs- Clear to ausculation bilaterally, normal work of breathing Heart- IRegular rate and rhythm, no murmurs, rubs or gallops, PMI not laterally displaced GI- soft, NT, ND, + BS Extremities- no clubbing, cyanosis, or edema Skin- no rash or lesions Neuro- strength and sensation are intact  LABS: Basic Metabolic Panel:  Recent Labs  29/52/84 1630 04/08/13 0410  NA  --  132*  K  --  4.5  CL  --  101  CO2  --  18*  GLUCOSE  --  82  BUN  --  34*  CREATININE 1.58* 1.74*  CALCIUM  --  8.5  MG 2.1  --    CBC:  Recent Labs  04/07/13 1630 04/08/13 0410  WBC 5.2 4.6  HGB 11.2* 9.5*  HCT 31.1* 27.1*  MCV 84.3 86.9  PLT 123* 71*   Cardiac  Enzymes:  Recent Labs  04/07/13 1700 04/07/13 2209 04/08/13 0409  TROPONINI <0.30 <0.30 <0.30   Fasting Lipid Panel:  Recent Labs  04/08/13 0410  CHOL 105  HDL 22*  LDLCALC 60  TRIG 132  CHOLHDL 4.8   Thyroid Function Tests:  Recent Labs  04/07/13 1630  TSH 4.752*    RADIOLOGY: S/p PPM insertion, no pneumothorax  ASSESSMENT AND PLAN:  1. CHB 2. PAF 3. S/p PPM Rec: her PPM appears to be working normally but she is in atrial fibrillation this morning. Will plan to give Ibutilide, hopefully restoring NSR. Discharge timing pending the result of ibutilide.  Leonia Reeves.D

## 2013-04-09 NOTE — Progress Notes (Signed)
Tele alarmed V-tach, pt asymptomatic, awoke easily to voice, BP 161/42.  Review of strip showed rate approx 140, lasted about 12 seconds, slightly irregular, QRS 0.14.  Complexes look just like patient's intrinsic beats, possibly atrial tach.  Pt has history of a-flutter, has been off metoprolol since admission.  Returned to v-paced rhythm with HR 60's.  Call light in reach.

## 2013-04-09 NOTE — Progress Notes (Signed)
0825 Ibutilide iv 0.6mg  IV infussion given over 10 mins with dr Lewayne Bunting at bedside, followed by lopresso IV 5 mg., with satisfactory result. Post med EKG done. Vpacing per cardiac monitor.

## 2013-04-09 NOTE — Progress Notes (Signed)
Pt had episode of ventricular tacchycardia intermittently ( 3-11 beats) then back to vpaced / a flutter rhythm.   Pt  resting in bed, asymptomatic,  awake ,denies any discomforts. Ward Givens NP notified. Kept pt monitored . EKG done

## 2013-04-10 ENCOUNTER — Telehealth: Payer: Self-pay | Admitting: Physician Assistant

## 2013-04-10 ENCOUNTER — Encounter (HOSPITAL_COMMUNITY): Payer: Self-pay | Admitting: Nurse Practitioner

## 2013-04-10 DIAGNOSIS — I10 Essential (primary) hypertension: Secondary | ICD-10-CM

## 2013-04-10 DIAGNOSIS — I4891 Unspecified atrial fibrillation: Secondary | ICD-10-CM

## 2013-04-10 DIAGNOSIS — E785 Hyperlipidemia, unspecified: Secondary | ICD-10-CM | POA: Diagnosis present

## 2013-04-10 DIAGNOSIS — M810 Age-related osteoporosis without current pathological fracture: Secondary | ICD-10-CM | POA: Diagnosis present

## 2013-04-10 DIAGNOSIS — M1990 Unspecified osteoarthritis, unspecified site: Secondary | ICD-10-CM | POA: Diagnosis present

## 2013-04-10 DIAGNOSIS — I2510 Atherosclerotic heart disease of native coronary artery without angina pectoris: Secondary | ICD-10-CM | POA: Diagnosis present

## 2013-04-10 MED ORDER — AMIODARONE HCL 200 MG PO TABS: 200.0000 mg | ORAL_TABLET | Freq: Two times a day (BID) | ORAL | Status: DC

## 2013-04-10 MED ORDER — AMIODARONE HCL 200 MG PO TABS
200.0000 mg | ORAL_TABLET | Freq: Two times a day (BID) | ORAL | Status: DC
  Administered 2013-04-10: 15:00:00 200 mg via ORAL
  Filled 2013-04-10 (×2): qty 1

## 2013-04-10 MED ORDER — HYDRALAZINE HCL 25 MG PO TABS: 25.0000 mg | ORAL_TABLET | Freq: Three times a day (TID) | ORAL | Status: DC

## 2013-04-10 MED ORDER — APIXABAN 2.5 MG PO TABS
2.5000 mg | ORAL_TABLET | Freq: Two times a day (BID) | ORAL | Status: DC
  Administered 2013-04-10: 2.5 mg via ORAL
  Filled 2013-04-10 (×2): qty 1

## 2013-04-10 MED ORDER — NITROGLYCERIN 0.4 MG SL SUBL: 0.4000 mg | SUBLINGUAL_TABLET | SUBLINGUAL | Status: AC | PRN

## 2013-04-10 MED ORDER — APIXABAN 2.5 MG PO TABS: 2.5000 mg | ORAL_TABLET | Freq: Two times a day (BID) | ORAL | Status: DC

## 2013-04-10 NOTE — Evaluation (Signed)
Physical Therapy Evaluation Patient Details Name: Brittany Hickman MRN: [id number] DOB: May 02, 1921 Today's Date: 04/10/2013 Time: 1400-1431 PT Time Calculation (min): 31 min  PT Assessment / Plan / Recommendation History of Present Illness  pt presents with Dual Chamber Pacer implant.    Clinical Impression  Pt very motivated to return to baseline and has great family support.  No further PT needs at this time.  Anticipate D/C to home with family.  Will sign off.      PT Assessment  Patent does not need any further PT services    Follow Up Recommendations  No PT follow up;Supervision for mobility/OOB    Does the patient have the potential to tolerate intense rehabilitation      Barriers to Discharge        Equipment Recommendations  None recommended by PT    Recommendations for Other Services     Frequency      Precautions / Restrictions Precautions Precautions: Fall Restrictions Weight Bearing Restrictions: No   Pertinent Vitals/Pain "Just a little tender."  L shoulder/chest.        Mobility  Bed Mobility Bed Mobility: Not assessed Transfers Transfers: Sit to Stand;Stand to Sit Sit to Stand: 5: Supervision;With upper extremity assist;From chair/3-in-1 Stand to Sit: 5: Supervision;With upper extremity assist;To chair/3-in-1 Details for Transfer Assistance: pt needs UEs to perform transfers without physical A.   Ambulation/Gait Ambulation/Gait Assistance: 4: Min guard Ambulation Distance (Feet): 160 Feet Assistive device: Rolling walker Ambulation/Gait Assistance Details: cues for positioning within RW.   Gait Pattern: Step-through pattern;Decreased stride length Stairs: Yes Stairs Assistance: 4: Min assist Stairs Assistance Details (indicate cue type and reason): cues for safety.   Stair Management Technique: One rail Left Number of Stairs: 1 Wheelchair Mobility Wheelchair Mobility: No    Exercises     PT Diagnosis:    PT Problem List:   PT Treatment  Interventions:       PT Goals(Current goals can be found in the care plan section)    Visit Information  Last PT Received On: 04/10/13 Assistance Needed: +1 History of Present Illness: pt presents with Dual Chamber Pacer implant.         Prior Functioning  Home Living Family/patient expects to be discharged to:: Private residence Living Arrangements: Alone Available Help at Discharge: Family;Available 24 hours/day Type of Home: Apartment Home Access: Stairs to enter Entrance Stairs-Number of Steps: 1 Entrance Stairs-Rails: None Home Layout: One level Home Equipment: Walker - 2 wheels;Bedside commode Prior Function Level of Independence: Independent with assistive device(s) Communication Communication: HOH    Cognition  Cognition Arousal/Alertness: Awake/alert Behavior During Therapy: WFL for tasks assessed/performed Overall Cognitive Status: Within Functional Limits for tasks assessed    Extremity/Trunk Assessment Upper Extremity Assessment Upper Extremity Assessment: LUE deficits/detail LUE Deficits / Details: Not fully assessed 2/2 new pacer implant Lower Extremity Assessment Lower Extremity Assessment: Overall WFL for tasks assessed   Balance Balance Balance Assessed: No  End of Session PT - End of Session Equipment Utilized During Treatment: Gait belt Activity Tolerance: Patient tolerated treatment well Patient left: in chair;with call bell/phone within reach;with family/visitor present Nurse Communication: Mobility status  GP     Sunny Schlein, [place] [phone] 04/10/2013, 2:44 PM

## 2013-04-10 NOTE — Care Management Note (Addendum)
  Page 1 of 1   04/10/2013     11:18:20 AM   CARE MANAGEMENT NOTE 04/10/2013  Patient:  Brittany Hickman, Brittany Hickman   Account Number:  [id number]  Date Initiated:  04/08/2013  Documentation initiated by:  Donn Pierini  Subjective/Objective Assessment:   Pt admitted with heart block plan for pacemaker today/ home alone daughter nearby     Action/Plan:   pacemaker insertion/ return home with HH/ self care. Benefits check for Eliquis   Anticipated DC Date:  04/10/2013   Anticipated DC Plan:  HOME/SELF CARE      DC Planning Services  CM consult      Choice offered to / List presented to:             Status of service:  In process, will continue to follow Medicare Important Message given?   (If response is "NO", the following Medicare IM given date fields will be blank) Date Medicare IM given:   Date Additional Medicare IM given:    Discharge Disposition:    Per UR Regulation:  Reviewed for med. necessity/level of care/duration of stay  If discussed at Long Length of Stay Meetings, dates discussed:    Comments:  04/10/13 1045 Oletta Cohn, RN, BSN, [employer] [phone] Spoke with pt  and daughters [name] and Velna Hatchet) at bedside regarding benefits check for Eliquis.  Pt has brochure with 30 day free card and refill assistance card intact.  Pt utilizes [employer] in [place] for prescription needs.  NCM called pharmacy to confirm availability of medication. [facility] in [place] will not have in stock until tomorrow evening, so pt will fill inital prescription at [facility] in [place].   Information relayed to pt and daughters.  Pt and daughters verbalize importance of filling medication upon discharge.

## 2013-04-10 NOTE — Discharge Summary (Signed)
Discharge Summary   Patient ID: Brittany Hickman,  MRN: [id number], DOB/AGE: 12/25/1920 79 y.o.  Admit date: 04/07/2013 Discharge date: 04/10/2013  Primary Care Provider: Selinda Flavin Primary Cardiologist: J. Hochrein, MD  Primary Electrophysiologist: G. Ladona Ridgel, MD [facility])  Discharge Diagnoses Principal Problem:   Complete heart block  **s/p Medtronic PPM this admission.  Active Problems:   CAD (coronary artery disease)  **s/p CABG in 199.   HTN (hypertension)   Atrial fibrillation  **Newly diagnosed.  **s/p ibutilide cardioversion with subsequent reversion to atrial fibrillation.  **amiodarone and eliquis initiated this admission.   CKD III   Hyperlipidemia   Osteoarthritis   Osteoporosis  Allergies No Known Allergies  Procedures  2D Echocardiogram 12.2.2014  Study Conclusions  - Left ventricle: The cavity size was normal. There was mild focal basal hypertrophy of the septum. Systolic function was normal. The estimated ejection fraction was in the range of 60% to 65%. Wall motion was normal; there were no regional wall motion abnormalities. - Mitral valve: Calcified annulus. Mild regurgitation. - Left atrium: The atrium was moderately dilated. - Right atrium: The atrium was mildly dilated. - Tricuspid valve: Moderate regurgitation. - Pulmonary arteries: Systolic pressure was mildly increased. PA peak pressure: 53mm Hg (S). _____________   Permanent Pacemaker Placement 12.2.2014  Medtronic [place] model [id number] (serial number [id number]). _____________   Ibutilide Cardioversion 12.3.2014  Initially successful however she later reverted to atrial fibrillation. _____________   History of Present Illness  79 year old female with prior history of coronary artery disease status post coronary artery bypass grafting in 1999. She has not been following up with cardiology but is followed closely by her primary care provider in [place]. Over the past few  months, she is noted progressive weakness and difficulty ambulating as well as intermittent substernal chest discomfort, lightheadedness, and left-sided numbness. On the morning of admission, she awoke to use the toilet and noted left-sided paresthesias and weakness. This became associated with nausea and lightheadedness prompting her and her family to present to the [facility] emergency department. There, she was found to have high-grade heart block with Mobitz 2 and intermittent complete heart block. Labs revealed mild renal insufficiency and hyperkalemia. She had been on beta blocker therapy at home previously. She was transferred to [facility] for further evaluation.  Hospital Course  Upon arrival to [facility], patient was asymptomatic. Heart rates remained in the high 20s to 30s and she was hypertensive with blood pressures in the 180s. Home dose of beta blocker, ARB, and diuretic therapy were held in the setting of bradycardia, renal insufficiency, and hyperkalemia. Patient remained hemodynamically stable and a 2-D echocardiogram was performed and showed normal LV function. Electrophysiology was consulted and given persistent bradycardia and high-grade heart block despite discontinuation of beta blocker therapy and correction of electrolytes, decision was made to pursue pacemaker placement. She underwent successful placement of a Medtronic Sensia dual-chamber permanent pacemaker on December 2. Following pacemaker placement, she was noted to convert into atrial fibrillation with relative rate control. On the morning of December 3, she complained of fatigue and dizziness and decision was made to pursue ibutilide cardioversion. This was successfully carried out however on the evening of December 3, she reverted back to atrial fibrillation. This morning, she remained in atrial fibrillation with rates generally in the 70s. We have initiated amiodarone therapy and given a CHA2DS2VASc of 5, we have also  initiated eliquis 2.5 mg twice a day.  She reported weakness and has been evaluated by  physical therapy and was able to ambulate independently.  She was not felt to require any home health PT.   We have arranged for device clinic followup next week as well as a transition of care appointment at which time we'll reassess her symptoms, a CBC, bmet. We will plan on repeat followup in approximately 3 weeks and if she remains in atrial fibrillation at that time, we will pursue cardioversion.  Discharge Vitals Blood pressure 165/38, pulse 53, temperature 98 F (36.7 C), temperature source Oral, resp. rate 18, height 5\' 4"  (1.626 m), weight 123 lb 3.8 oz (55.9 kg), SpO2 94.00%.  Filed Weights   04/08/13 2200 04/09/13 0342 04/10/13 0500  Weight: 121 lb 7.6 oz (55.1 kg) 121 lb 7.6 oz (55.1 kg) 123 lb 3.8 oz (55.9 kg)   Labs  CBC  Recent Labs  04/08/13 0410 04/09/13 0539  WBC 4.6 4.9  HGB 9.5* 10.0*  HCT 27.1* 28.5*  MCV 86.9 86.4  PLT 71* 104*   Basic Metabolic Panel  Recent Labs  04/07/13 1630 04/08/13 0410 04/09/13 0539  NA  --  132* 132*  K  --  4.5 5.0  CL  --  101 105  CO2  --  18* 13*  GLUCOSE  --  82 98  BUN  --  34* 28*  CREATININE 1.58* 1.74* 1.50*  CALCIUM  --  8.5 8.4  MG 2.1  --  2.0   Cardiac Enzymes  Recent Labs  04/07/13 1700 04/07/13 2209 04/08/13 0409  TROPONINI <0.30 <0.30 <0.30   Fasting Lipid Panel  Recent Labs  04/08/13 0410  CHOL 105  HDL 22*  LDLCALC 60  TRIG 161  CHOLHDL 4.8   Thyroid Function Tests  Recent Labs  04/07/13 1630  TSH 4.752*   Disposition  Pt is being discharged home today in good condition.  Follow-up Plans & Appointments  Follow-up Information   Follow up with [facility] On 04/17/2013. (4:30 PM)    Contact information:   [address] [place], [place] [address] [phone]      Follow up with Dr. Sharrell Ku On 07/14/2013. (10:30 AM)    Contact information:    [address] [place] [address] [phone]      Follow up with Tereso Newcomer, PA-C On 04/17/2013. (3:20 PM - Dr. Lubertha Basque PA)    Specialty:  Physician Assistant   Contact information:   [address] [place] [address] [phone]       Follow up with Tereso Newcomer, PA-C On 05/09/2013. (12:10 PM)    Specialty:  Physician Assistant   Contact information:   [address] [place] [address] [phone]      Discharge Medications    Medication List    STOP taking these medications       aspirin EC 81 MG tablet     olmesartan 40 MG tablet  Commonly known as:  BENICAR      TAKE these medications       amiodarone 200 MG tablet  Commonly known as:  PACERONE  Take 1 tablet (200 mg total) by mouth 2 (two) times daily.     amLODipine 10 MG tablet  Commonly known as:  NORVASC  Take 10 mg by mouth daily.     apixaban 2.5 MG Tabs tablet  Commonly known as:  ELIQUIS  Take 1 tablet (2.5 mg total) by mouth 2 (two) times  daily.     CALCIUM PO  Take 1 tablet by mouth daily.     estradiol 0.1 MG/GM vaginal cream  Commonly known as:  ESTRACE  Place 1 Applicatorful vaginally once a week.     hydrALAZINE 25 MG tablet  Commonly known as:  APRESOLINE  Take 1 tablet (25 mg total) by mouth every 8 (eight) hours.     hydrochlorothiazide 25 MG tablet  Commonly known as:  HYDRODIURIL  Take 25 mg by mouth daily.     metoprolol 50 MG tablet  Commonly known as:  LOPRESSOR  Take 50 mg by mouth 2 (two) times daily.     MULTIVITAMIN PO  Take 1 tablet by mouth daily.     nitroGLYCERIN 0.4 MG SL tablet  Commonly known as:  NITROSTAT  Place 1 tablet (0.4 mg total) under the tongue every 5 (five) minutes x 3 doses as needed for chest pain.     raloxifene 60 MG tablet  Commonly known as:  EVISTA  Take 60 mg by mouth daily.       Outstanding Labs/Studies  F/u CBC and BMET in 1 wk.  Duration of Discharge Encounter   Greater  than 30 minutes including physician time.  Signed, Nicolasa Ducking NP 04/10/2013, 9:56 AM  EP Attending  Patient seen and examined. Agree with above exam, assessment and plan.  Leonia Reeves.D.

## 2013-04-10 NOTE — Telephone Encounter (Signed)
New problem   TCM 04/17/13 @ 3:20 per Thayer Ohm PA

## 2013-04-10 NOTE — Progress Notes (Signed)
Patient Name: Brittany Hickman Date of Encounter: 04/10/2013   Principal Problem:   Complete heart block Active Problems:   CAD (coronary artery disease)   HTN (hypertension)   Atrial fibrillation   Hyperlipidemia   Osteoarthritis   Osteoporosis    SUBJECTIVE  S/p ibutilide cardioversion yesterday.  Back in afib since last night.  Feels weak this am. Hasn't really ambulated.  CURRENT MEDS . amLODipine  5 mg Oral Daily  . aspirin EC  81 mg Oral Daily  . hydrALAZINE  50 mg Oral Q8H   OBJECTIVE  Filed Vitals:   04/09/13 2115 04/09/13 2338 04/10/13 0334 04/10/13 0500  BP: 167/61 162/43 165/38   Pulse:  60 53   Temp:  99.2 F (37.3 C) 98 F (36.7 C)   TempSrc:  Oral Oral   Resp:  19 18   Height:      Weight:    123 lb 3.8 oz (55.9 kg)  SpO2:  94% 94%     Intake/Output Summary (Last 24 hours) at 04/10/13 0925 Last data filed at 04/10/13 1610  Gross per 24 hour  Intake 1724.17 ml  Output   1500 ml  Net 224.17 ml   Filed Weights   04/08/13 2200 04/09/13 0342 04/10/13 0500  Weight: 121 lb 7.6 oz (55.1 kg) 121 lb 7.6 oz (55.1 kg) 123 lb 3.8 oz (55.9 kg)    PHYSICAL EXAM  General: Pleasant, NAD. Neuro: Alert and oriented X 3. Moves all extremities spontaneously. Psych: Normal affect. HEENT:  Very HOH.  Neck: Supple without bruits or JVD. Lungs:  Resp regular and unlabored, basilar crackles bilat. Heart: irreg, no s3, s4, or murmurs. Chest: left upper chest pacer site w/o bleeding or hematoma. Abdomen: Soft, non-tender, non-distended, BS + x 4.  Extremities: No clubbing, cyanosis or edema. DP/PT/Radials 2+ and equal bilaterally.  Accessory Clinical Findings  CBC  Recent Labs  04/08/13 0410 04/09/13 0539  WBC 4.6 4.9  HGB 9.5* 10.0*  HCT 27.1* 28.5*  MCV 86.9 86.4  PLT 71* 104*   Basic Metabolic Panel  Recent Labs  04/07/13 1630 04/08/13 0410 04/09/13 0539  NA  --  132* 132*  K  --  4.5 5.0  CL  --  101 105  CO2  --  18* 13*  GLUCOSE  --   82 98  BUN  --  34* 28*  CREATININE 1.58* 1.74* 1.50*  CALCIUM  --  8.5 8.4  MG 2.1  --  2.0   Cardiac Enzymes  Recent Labs  04/07/13 1700 04/07/13 2209 04/08/13 0409  TROPONINI <0.30 <0.30 <0.30   Fasting Lipid Panel  Recent Labs  04/08/13 0410  CHOL 105  HDL 22*  LDLCALC 60  TRIG 960  CHOLHDL 4.8   Thyroid Function Tests  Recent Labs  04/07/13 1630  TSH 4.752*    TELE  Afib with pacing on demand.  Mostly in 70's. No VT as previously reported by nsg - tachycardia was faster afib.  ECG  V paced, 60, underlying afib.  Radiology/Studies  Dg Chest 2 View  04/09/2013   CLINICAL DATA:  Post pacemaker insertion  EXAM: CHEST  2 VIEW  COMPARISON:  Prior radiograph from 04/07/2013  FINDINGS: There has been interval placement of a left-sided dual lead transvenous pacemaker/AICD with electrodes overlying the right atrium and right ventricle. Sequelae of prior CABG again noted. Transverse heart size is stable in size and contour, and remain is within normal limits.  Lungs are mildly hypoinflated.  There has been interval development of diffuse pulmonary vascular congestion, new as compared to prior. Small bilateral pleural effusions are suspected. No pneumothorax identified.  Osseous structures are unchanged.  IMPRESSION: 1. Interval placement of dual lead left-sided transvenous pacemaker/AICD without complication. No pneumothorax. 2. Interval development of mild diffuse pulmonary vascular congestion with small bilateral pleural effusions.   Electronically Signed   By: Rise Mu M.D.   On: 04/09/2013 06:52    ASSESSMENT AND PLAN  1.  Complete Heart Block:  S/p MDT dual chamber PPM on 12/2.  No complications related to pacer.  F/u device clinic in 10 days/JA 3 months.  2.  Afib:  New dx yesterday.  S/p cardioversion with ibutilide yesterday however she went back into afib last night.  She reports feeling weak this AM.  Rate is controlled in the 70's to 80's.  We will  initiate amiodarone 200mg  bid along with eliquis 2.5mg  bid (CHA2DS2VASc = 5).  Plan to cardiovert in 3 wks if she remains in afib at that time.  3.  HTN:  Improved.  Resume prior home dose of bb/diuretic.  No ARB in setting of hyperkalemia.  4.  CKD III:  Creat 1.5 yesterday.  Resume arb on d/c.  5.  Anemia/Thrombocytopenia: stable.  F/u cbc in 1 wk considering addition of eliquis.  6.  Hyperkalemia:  K+ 5.2 on admit, 5.0 yesterday.  As above, keep off arb.  F/u bmet in 1 wk.  7.  Deconditioning: She feels weak this AM.  She lives by herself but has help @ home.  Will ask PT to see.  Signed, Nicolasa Ducking NP  EP Attending  Patient seen and examined. Agree with above exam, assessment, and plan as outlined by Mr. Brion Aliment with my input.   Leonia Reeves.D.

## 2013-04-11 NOTE — Telephone Encounter (Signed)
TCM call to patient spoke to daughter.She stated mother is doing great since discharged from hospital.Stated she understands discharge instructions and how to take her medications.Advised to keep appointment with Tereso Newcomer PA 12/11/4 at 3:20 pm.

## 2013-04-17 ENCOUNTER — Other Ambulatory Visit: Payer: Medicare Other

## 2013-04-17 ENCOUNTER — Encounter: Payer: Self-pay | Admitting: Physician Assistant

## 2013-04-17 ENCOUNTER — Ambulatory Visit (INDEPENDENT_AMBULATORY_CARE_PROVIDER_SITE_OTHER): Payer: Medicare Other | Admitting: *Deleted

## 2013-04-17 ENCOUNTER — Ambulatory Visit (INDEPENDENT_AMBULATORY_CARE_PROVIDER_SITE_OTHER): Payer: Medicare Other | Admitting: Physician Assistant

## 2013-04-17 VITALS — BP 164/60 | HR 60 | Ht 65.0 in | Wt 128.0 lb

## 2013-04-17 DIAGNOSIS — I4891 Unspecified atrial fibrillation: Secondary | ICD-10-CM

## 2013-04-17 DIAGNOSIS — I442 Atrioventricular block, complete: Secondary | ICD-10-CM

## 2013-04-17 DIAGNOSIS — E785 Hyperlipidemia, unspecified: Secondary | ICD-10-CM

## 2013-04-17 DIAGNOSIS — I10 Essential (primary) hypertension: Secondary | ICD-10-CM

## 2013-04-17 DIAGNOSIS — I2510 Atherosclerotic heart disease of native coronary artery without angina pectoris: Secondary | ICD-10-CM

## 2013-04-17 DIAGNOSIS — I441 Atrioventricular block, second degree: Secondary | ICD-10-CM

## 2013-04-17 DIAGNOSIS — Z79899 Other long term (current) drug therapy: Secondary | ICD-10-CM

## 2013-04-17 LAB — MDC_IDC_ENUM_SESS_TYPE_INCLINIC
Battery Impedance: 100 Ohm
Battery Remaining Longevity: 107 mo
Battery Voltage: 2.8 V
Brady Statistic AP VP Percent: 60 %
Brady Statistic AS VS Percent: 1 %
Lead Channel Impedance Value: 692 Ohm
Lead Channel Pacing Threshold Pulse Width: 0.4 ms
Lead Channel Sensing Intrinsic Amplitude: 11.2 mV
Lead Channel Sensing Intrinsic Amplitude: 2 mV
Lead Channel Setting Pacing Amplitude: 3.5 V
Lead Channel Setting Pacing Amplitude: 3.5 V
Lead Channel Setting Pacing Pulse Width: 0.4 ms
Lead Channel Setting Sensing Sensitivity: 4 mV

## 2013-04-17 MED ORDER — HYDRALAZINE HCL 25 MG PO TABS: 37.5000 mg | ORAL_TABLET | Freq: Three times a day (TID) | ORAL | Status: DC

## 2013-04-17 MED ORDER — AMIODARONE HCL 200 MG PO TABS
200.0000 mg | ORAL_TABLET | Freq: Every day | ORAL | Status: DC
Start: 2013-05-01 — End: 2013-04-26

## 2013-04-17 NOTE — Patient Instructions (Signed)
Your physician has recommended you make the following change in your medication:   INCREASE HYDRALAZINE (37.5 MG) 3 X DAY CONTINUE TAKING AMIODARONE TWICE A DAY FOR TWO WEEKS THAN DECREASE TO ONCE A DAY   Your physician recommends that you HAVE lab work TODAY:BMET/CBC   Your physician recommends that you KEEP YOUR scheduled follow-up appointment with Tereso Newcomer, PA-C on Thursday, January 6th @ 11:10 am   Keep checking Blood Pressure and keep a list bring the list to your next appointment

## 2013-04-17 NOTE — Progress Notes (Signed)
Wound check appointment. Steri-strips removed. Wound without redness or edema. Incision edges approximated, wound well healed. Normal device function. Thresholds, sensing, and impedances consistent with implant measurements. Device programmed at 3.5V/auto capture programmed on for extra safety margin until 3 month visit. Histogram distribution appropriate for patient and level of activity. 24 mode switches, 18.7%, + eliquis.  No  high ventricular rates noted. Patient educated about wound care, arm mobility, lifting restrictions. ROV in 3 months with implanting physician.

## 2013-04-17 NOTE — Progress Notes (Signed)
[address] [place], [place]  [address] Phone: [phone] Fax:  [phone]  Date:  04/17/2013   ID:  Brittany Hickman, DOB 1921/01/14, MRN [id number]  PCP:  Selinda Flavin, MD  Cardiologist:  Dr. Rollene Rotunda   Electrophysiologist:  Dr. Lewayne Bunting    History of Present Illness: Brittany Hickman is a 79 y.o. female With a history of CAD, status post CABG in 1999, HTN, CKD stage III, HL. She was admitted 12/1-12/4 after presenting with symptomatic high grade heart block. Her beta blocker was discontinued. She continued to have Mobitz Type 2 heart block. Echo (04/08/2013): Mild focal basal hypertrophy of the septum, EF 60-65%, mild MR, mod LAE, mild RAE, mod TR, PASP 53. She was seen by EP who recommend permanent pacemaker implantation.  She underwent placement of a Medtronic dual chamber pacer by Dr. Hillis Range.  The patient developed atrial fibrillation after implantation of her device. She underwent chemical cardioversion with ibutilide. She initially was converted to NSR but then reverted back to atrial fibrillation. Heart rate remained controlled. She was placed on amiodarone 200 mg BID.  CHADS2-VASc=5.  She was also placed on Eliquis 2.5 bid.  Plan is to pursue outpatient cardioversion in approximately 3 weeks if she remains in atrial fibrillation.  She is here with her daughter today. Her daughter helps with the history as the patient has significant hearing loss. She has felt much better since discharge from the hospital. Her breathing is improved. She denies chest pain. She sometimes gets dizzy with standing. This is a chronic symptom without significant change. She denies PND or orthopnea. She has chronic LE edema (L >R). This seems to be more dependent than anything else.  Recent Labs: 04/07/2013: TSH 4.752*  04/08/2013: HDL 22*; LDL (calc) 60  04/09/2013: Creatinine 1.50*; Hemoglobin 10.0*; Potassium 5.0   Wt Readings from Last 3 Encounters:  04/17/13 128 lb (58.06  kg)  04/10/13 123 lb 3.8 oz (55.9 kg)  04/10/13 123 lb 3.8 oz (55.9 kg)     Past Medical History  Diagnosis Date  . CAD (coronary artery disease)     a. s/p 5 vessel CABG in 1999.  Marland Kitchen HTN (hypertension)   . Hyperlipidemia   . Osteoarthritis   . Osteoporosis   . Mobitz II     a. 04/2013: s/p MDT Sensia DC PPM model SEDR0 1 (ser # [id number]  . Atrial fibrillation     a. Dx 04/2013-->ibutilide cardioversion-->recurrent afib within hours-->eliquis initiated.    Current Outpatient Prescriptions  Medication Sig Dispense Refill  . amiodarone (PACERONE) 200 MG tablet Take 1 tablet (200 mg total) by mouth 2 (two) times daily.  60 tablet  3  . amLODipine (NORVASC) 10 MG tablet Take 10 mg by mouth daily.      Marland Kitchen apixaban (ELIQUIS) 2.5 MG TABS tablet Take 1 tablet (2.5 mg total) by mouth 2 (two) times daily.  60 tablet  6  . CALCIUM PO Take 1 tablet by mouth daily.      Marland Kitchen estradiol (ESTRACE) 0.1 MG/GM vaginal cream Place 1 Applicatorful vaginally once a week.      . hydrALAZINE (APRESOLINE) 25 MG tablet Take 1 tablet (25 mg total) by mouth every 8 (eight) hours.  90 tablet  6  . hydrochlorothiazide (HYDRODIURIL) 25 MG tablet Take 25 mg by mouth daily.      . metoprolol (LOPRESSOR) 50 MG tablet Take 50 mg by mouth 2 (two) times daily.      Marland Kitchen  Multiple Vitamins-Minerals (CENTRUM SILVER ADULT 50+) TABS Take by mouth daily.      . nitroGLYCERIN (NITROSTAT) 0.4 MG SL tablet Place 1 tablet (0.4 mg total) under the tongue every 5 (five) minutes x 3 doses as needed for chest pain.  25 tablet  3  . raloxifene (EVISTA) 60 MG tablet Take 60 mg by mouth daily.       No current facility-administered medications for this visit.    Allergies:   Review of patient's allergies indicates no known allergies.   Social History:  The patient  reports that she has never smoked. She does not have any smokeless tobacco history on file. She reports that she does not drink alcohol or use illicit drugs.   Family  History:  The patient's family history includes Diabetes in her sister; Gastric cancer in her father; Stroke in her mother.   ROS:  Please see the history of present illness.   She denies any bleeding problems. She denies fevers or cough.   All other systems reviewed and negative.   PHYSICAL EXAM: VS:  BP 164/60  Pulse 60  Ht 5\' 5"  (1.651 m)  Wt 128 lb (58.06 kg)  BMI 21.30 kg/m2  SpO2 97% Well nourished, well developed, in no acute distress HEENT: normal Neck: no JVD Chest: Pacer site dressing intact and clean Cardiac:  normal S1, S2; RRR; no murmur Lungs:  clear to auscultation bilaterally, no wheezing, rhonchi or rales Abd: soft, nontender, no hepatomegaly Ext: 1+ left ankle edema; trace right ankle edema Skin: warm and dry Neuro:  CNs 2-12 intact, no focal abnormalities noted  EKG:  AV paced, HR 60     ASSESSMENT AND PLAN:  1. Atrial Fibrillation:  She has returned to NSR. Continue current dose of amiodarone for 2 more weeks. She will then decrease to 200 mg daily. Continue Apixaban. Check a follow up basic metabolic panel and CBC today. 2. Mobitz type II, Status Post Pacemaker:  She has been evaluated by the pacemaker clinic today. 3. CAD:  No angina. She is not on aspirin as she is now on Apixaban.  4. Hypertension:  Uncontrolled. Increase hydralazine to 37.5 mg 3 times a day.  She will keep track of her blood pressures and bring in recordings to her next visit. 5. Chronic Kidney Disease:  Obtain follow up basic metabolic panel today. 6. Disposition:  Keep follow up with me in 3 weeks  Signed, Tereso Newcomer, PA-C  04/17/2013 3:44 PM

## 2013-04-18 ENCOUNTER — Telehealth: Payer: Self-pay | Admitting: *Deleted

## 2013-04-18 DIAGNOSIS — I10 Essential (primary) hypertension: Secondary | ICD-10-CM

## 2013-04-18 LAB — CBC WITH DIFFERENTIAL/PLATELET
Basophils Absolute: 0 10*3/uL (ref 0.0–0.1)
Basophils Relative: 0.4 % (ref 0.0–3.0)
Eosinophils Relative: 1.3 % (ref 0.0–5.0)
HCT: 30.1 % — ABNORMAL LOW (ref 36.0–46.0)
Hemoglobin: 10.2 g/dL — ABNORMAL LOW (ref 12.0–15.0)
Lymphs Abs: 1.1 10*3/uL (ref 0.7–4.0)
MCV: 88.6 fl (ref 78.0–100.0)
Monocytes Absolute: 0.5 10*3/uL (ref 0.1–1.0)
Monocytes Relative: 7.3 % (ref 3.0–12.0)
Neutro Abs: 4.6 10*3/uL (ref 1.4–7.7)
Platelets: 179 10*3/uL (ref 150.0–400.0)
WBC: 6.2 10*3/uL (ref 4.5–10.5)

## 2013-04-18 LAB — BASIC METABOLIC PANEL
BUN: 24 mg/dL — ABNORMAL HIGH (ref 6–23)
CO2: 23 mEq/L (ref 19–32)
Calcium: 8.5 mg/dL (ref 8.4–10.5)
Chloride: 97 mEq/L (ref 96–112)
GFR: 31.03 mL/min — ABNORMAL LOW (ref >60.00)
Potassium: 4.2 mEq/L (ref 3.5–5.1)
Sodium: 127 mEq/L — ABNORMAL LOW (ref 135–145)

## 2013-04-18 MED ORDER — HYDROCHLOROTHIAZIDE 25 MG PO TABS: 12.5000 mg | ORAL_TABLET | Freq: Every day | ORAL | Status: DC

## 2013-04-18 NOTE — Telephone Encounter (Signed)
Pt's daughter [name] was notified about pt's lab results and to decrease HCTZ to 12.5 mg daily; bmet will be done on 05/13/13 when she comes in to see Bing Neighbors. PA; [name] states that she will be out of town after [place] which is when the 2 week for bmet should be. I said I thought that would be ok and I would let Tereso Newcomer, PA know ; [name] said thank you and verbalized understanding to instructions

## 2013-04-18 NOTE — Telephone Encounter (Signed)
[employer] rx approval for eliquis until 04/16/2014 PA # [id number]

## 2013-04-18 NOTE — Telephone Encounter (Signed)
Message copied by Tarri Fuller on Fri Apr 18, 2013  4:59 PM ------      Message from: [place], [place] T      Created: Fri Apr 18, 2013 12:36 PM       Hemoglobin stable      Creatinine stable      Potassium okay      Sodium low - decrease HCTZ to 12.5 mg daily      Repeat BMET in 2 weeks      Tereso Newcomer, PA-C        04/18/2013 12:36 PM ------

## 2013-04-21 ENCOUNTER — Telehealth: Payer: Self-pay | Admitting: Physician Assistant

## 2013-04-21 NOTE — Telephone Encounter (Signed)
New problem   Stated Brittany Hickman had called her and is waiting for a call back phone call. Please call pt's daughter she need to know if she need to give pt her afternoon meds.

## 2013-04-21 NOTE — Telephone Encounter (Signed)
Ok to resume medications as she usually would this afternoon which would put her back on her usual schedule. Tereso Newcomer, PA-C   04/21/2013 1:49 PM

## 2013-04-21 NOTE — Telephone Encounter (Signed)
s/w pt's daughter [name] with the recommendationsa per Lorin Picket w. PA to take afternoon dose of hydralazine now and then evening meds later this evening. Daughter [name] verbalized understanding.

## 2013-04-21 NOTE — Telephone Encounter (Signed)
New Problem:  Pt's daughter states her mom seems a little confused. After looking at her medicines it looks like her mom took Monday morning meds Sunday night. Daughter states she took her vitals and they are 155/64 BP and 62 HeartRate. Pt's daughter is requesting a call back.

## 2013-04-21 NOTE — Telephone Encounter (Signed)
rtnd pt's daughter Ocie Cornfield call about pt's med confusion yesterday. [name] states she thinks her mom took Monday 12/15 AM pills yestrday Sunday 12/14 afternoon.States pt told her that she did not feel well yestrday afternoon, did not sleep well, states to her daughter that yesetrday felt confused after she took what she thought was her afternoon meds. [name] (daughter) states pt is not confused today and seems more like herself. [name] does state that pt's BP today was 155/64 HR 62. She has not given her any meds today daughter is waiting for advice as what to do. i explained that I will d/w Bing Neighbors. PA about recommendation and cb in a short while. Meds that she took yesterday afternoon in error were  amiodarone 200 mg, norvasc 10 mg, eliquis 2.5 mg, hydralazine 37.5 mg, metoprolol 50 mg , hctz 12.5 mg.   Her only afternoon pill should be the hydralazine 37.5 mg. Pt's daughter states she has her mother's pills divided into AM, Afternoon, PM.

## 2013-04-23 ENCOUNTER — Inpatient Hospital Stay (HOSPITAL_COMMUNITY)
Admission: EM | Admit: 2013-04-23 | Discharge: 2013-04-26 | DRG: 292 | Disposition: A | Discharge status: Home / Self Care | Payer: Medicare Other | Attending: Internal Medicine | Admitting: Internal Medicine

## 2013-04-23 ENCOUNTER — Encounter (HOSPITAL_COMMUNITY): Payer: Self-pay | Admitting: Emergency Medicine

## 2013-04-23 ENCOUNTER — Emergency Department (HOSPITAL_COMMUNITY): Payer: Medicare Other

## 2013-04-23 ENCOUNTER — Telehealth: Payer: Self-pay | Admitting: Internal Medicine

## 2013-04-23 DIAGNOSIS — I2510 Atherosclerotic heart disease of native coronary artery without angina pectoris: Secondary | ICD-10-CM | POA: Diagnosis present

## 2013-04-23 DIAGNOSIS — M810 Age-related osteoporosis without current pathological fracture: Secondary | ICD-10-CM | POA: Diagnosis present

## 2013-04-23 DIAGNOSIS — I509 Heart failure, unspecified: Secondary | ICD-10-CM | POA: Diagnosis present

## 2013-04-23 DIAGNOSIS — Z950 Presence of cardiac pacemaker: Secondary | ICD-10-CM

## 2013-04-23 DIAGNOSIS — Z79899 Other long term (current) drug therapy: Secondary | ICD-10-CM

## 2013-04-23 DIAGNOSIS — R32 Unspecified urinary incontinence: Secondary | ICD-10-CM | POA: Diagnosis present

## 2013-04-23 DIAGNOSIS — E876 Hypokalemia: Secondary | ICD-10-CM | POA: Diagnosis present

## 2013-04-23 DIAGNOSIS — E039 Hypothyroidism, unspecified: Secondary | ICD-10-CM | POA: Diagnosis present

## 2013-04-23 DIAGNOSIS — Z800 Family history of malignant neoplasm of digestive organs: Secondary | ICD-10-CM

## 2013-04-23 DIAGNOSIS — I10 Essential (primary) hypertension: Secondary | ICD-10-CM

## 2013-04-23 DIAGNOSIS — Z951 Presence of aortocoronary bypass graft: Secondary | ICD-10-CM

## 2013-04-23 DIAGNOSIS — I2789 Other specified pulmonary heart diseases: Secondary | ICD-10-CM | POA: Diagnosis present

## 2013-04-23 DIAGNOSIS — I272 Pulmonary hypertension, unspecified: Secondary | ICD-10-CM

## 2013-04-23 DIAGNOSIS — Z820 Family history of epilepsy and other diseases of the nervous system: Secondary | ICD-10-CM

## 2013-04-23 DIAGNOSIS — Z833 Family history of diabetes mellitus: Secondary | ICD-10-CM

## 2013-04-23 DIAGNOSIS — N183 Chronic kidney disease, stage 3 unspecified: Secondary | ICD-10-CM | POA: Diagnosis present

## 2013-04-23 DIAGNOSIS — I498 Other specified cardiac arrhythmias: Secondary | ICD-10-CM | POA: Diagnosis present

## 2013-04-23 DIAGNOSIS — R600 Localized edema: Secondary | ICD-10-CM

## 2013-04-23 DIAGNOSIS — I5031 Acute diastolic (congestive) heart failure: Secondary | ICD-10-CM

## 2013-04-23 DIAGNOSIS — I129 Hypertensive chronic kidney disease with stage 1 through stage 4 chronic kidney disease, or unspecified chronic kidney disease: Secondary | ICD-10-CM | POA: Diagnosis present

## 2013-04-23 DIAGNOSIS — I4891 Unspecified atrial fibrillation: Secondary | ICD-10-CM | POA: Diagnosis present

## 2013-04-23 DIAGNOSIS — E871 Hypo-osmolality and hyponatremia: Secondary | ICD-10-CM | POA: Diagnosis present

## 2013-04-23 DIAGNOSIS — Z823 Family history of stroke: Secondary | ICD-10-CM

## 2013-04-23 DIAGNOSIS — I441 Atrioventricular block, second degree: Secondary | ICD-10-CM | POA: Diagnosis present

## 2013-04-23 DIAGNOSIS — M1990 Unspecified osteoarthritis, unspecified site: Secondary | ICD-10-CM | POA: Diagnosis present

## 2013-04-23 DIAGNOSIS — J811 Chronic pulmonary edema: Secondary | ICD-10-CM

## 2013-04-23 DIAGNOSIS — N179 Acute kidney failure, unspecified: Secondary | ICD-10-CM | POA: Diagnosis present

## 2013-04-23 DIAGNOSIS — D649 Anemia, unspecified: Secondary | ICD-10-CM | POA: Diagnosis present

## 2013-04-23 DIAGNOSIS — I5033 Acute on chronic diastolic (congestive) heart failure: Principal | ICD-10-CM | POA: Diagnosis present

## 2013-04-23 DIAGNOSIS — Z803 Family history of malignant neoplasm of breast: Secondary | ICD-10-CM

## 2013-04-23 DIAGNOSIS — E785 Hyperlipidemia, unspecified: Secondary | ICD-10-CM

## 2013-04-23 DIAGNOSIS — I442 Atrioventricular block, complete: Secondary | ICD-10-CM | POA: Diagnosis present

## 2013-04-23 DIAGNOSIS — T502X5A Adverse effect of carbonic-anhydrase inhibitors, benzothiadiazides and other diuretics, initial encounter: Secondary | ICD-10-CM | POA: Diagnosis present

## 2013-04-23 LAB — CBC
HCT: 28.4 % — ABNORMAL LOW (ref 36.0–46.0)
Hemoglobin: 10.2 g/dL — ABNORMAL LOW (ref 12.0–15.0)
MCH: 30.3 pg (ref 26.0–34.0)
MCHC: 35.9 g/dL (ref 30.0–36.0)
Platelets: 154 10*3/uL (ref 150–400)
RDW: 13.5 % (ref 11.5–15.5)
WBC: 5.5 10*3/uL (ref 4.0–10.5)

## 2013-04-23 LAB — COMPREHENSIVE METABOLIC PANEL
ALT: 16 U/L (ref 0–35)
AST: 21 U/L (ref 0–37)
Alkaline Phosphatase: 55 U/L (ref 39–117)
CO2: 20 mEq/L (ref 19–32)
Calcium: 8.4 mg/dL (ref 8.4–10.5)
Chloride: 87 mEq/L — ABNORMAL LOW (ref 96–112)
GFR calc Af Amer: 34 mL/min — ABNORMAL LOW (ref >90)
GFR calc non Af Amer: 29 mL/min — ABNORMAL LOW (ref >90)
Glucose, Bld: 189 mg/dL — ABNORMAL HIGH (ref 70–99)
Potassium: 3.6 mEq/L (ref 3.5–5.1)
Sodium: 120 mEq/L — ABNORMAL LOW (ref 135–145)
Total Protein: 6.1 g/dL (ref 6.0–8.3)

## 2013-04-23 LAB — CBC WITH DIFFERENTIAL/PLATELET
Basophils Absolute: 0 10*3/uL (ref 0.0–0.1)
Lymphocytes Relative: 15 % (ref 12–46)
Lymphs Abs: 0.7 10*3/uL (ref 0.7–4.0)
MCH: 30.3 pg (ref 26.0–34.0)
MCHC: 35.7 g/dL (ref 30.0–36.0)
Neutrophils Relative %: 78 % — ABNORMAL HIGH (ref 43–77)
Platelets: 170 10*3/uL (ref 150–400)
RBC: 3.3 MIL/uL — ABNORMAL LOW (ref 3.87–5.11)
RDW: 13.6 % (ref 11.5–15.5)
WBC: 4.7 10*3/uL (ref 4.0–10.5)

## 2013-04-23 LAB — TSH: TSH: 3.468 u[IU]/mL (ref 0.350–4.500)

## 2013-04-23 LAB — T4, FREE: Free T4: 1.69 ng/dL (ref 0.80–1.80)

## 2013-04-23 MED ORDER — FUROSEMIDE 10 MG/ML IJ SOLN
40.0000 mg | Freq: Once | INTRAMUSCULAR | Status: AC
  Administered 2013-04-23: 40 mg via INTRAVENOUS
  Filled 2013-04-23: qty 4

## 2013-04-23 MED ORDER — AMIODARONE HCL 200 MG PO TABS
200.0000 mg | ORAL_TABLET | Freq: Every day | ORAL | Status: DC
  Administered 2013-04-23: 200 mg via ORAL
  Filled 2013-04-23: qty 1

## 2013-04-23 MED ORDER — APIXABAN 2.5 MG PO TABS
2.5000 mg | ORAL_TABLET | Freq: Two times a day (BID) | ORAL | Status: DC
  Administered 2013-04-23 – 2013-04-26 (×6): 2.5 mg via ORAL
  Filled 2013-04-23 (×10): qty 1

## 2013-04-23 MED ORDER — RALOXIFENE HCL 60 MG PO TABS
60.0000 mg | ORAL_TABLET | Freq: Every day | ORAL | Status: DC
  Administered 2013-04-24 – 2013-04-26 (×3): 60 mg via ORAL
  Filled 2013-04-23 (×3): qty 1

## 2013-04-23 MED ORDER — METOPROLOL TARTRATE 25 MG PO TABS
12.5000 mg | ORAL_TABLET | Freq: Two times a day (BID) | ORAL | Status: DC
  Administered 2013-04-23 – 2013-04-24 (×2): 12.5 mg via ORAL
  Filled 2013-04-23 (×2): qty 1

## 2013-04-23 MED ORDER — PNEUMOCOCCAL VAC POLYVALENT 25 MCG/0.5ML IJ INJ: 0.5000 mL | INJECTION | Freq: Once | INTRAMUSCULAR | Status: DC

## 2013-04-23 MED ORDER — SODIUM CHLORIDE 0.9 % IV SOLN
INTRAVENOUS | Status: DC
  Administered 2013-04-23 – 2013-04-25 (×3): via INTRAVENOUS

## 2013-04-23 MED ORDER — FUROSEMIDE 10 MG/ML IJ SOLN
20.0000 mg | Freq: Two times a day (BID) | INTRAMUSCULAR | Status: DC
  Administered 2013-04-23 – 2013-04-24 (×2): 20 mg via INTRAVENOUS
  Filled 2013-04-23 (×2): qty 2

## 2013-04-23 MED ORDER — INFLUENZA VIRUS VACC SPLIT PF IM SUSP: 0.5000 mL | Freq: Once | INTRAMUSCULAR | Status: DC

## 2013-04-23 NOTE — ED Notes (Signed)
hospitalist in to see pt.

## 2013-04-23 NOTE — ED Notes (Signed)
Pt reports had pace maker put in approx 2 weeks ago.  Reports has had SOB and swelling in extremities.  C/O generalized weakness and decreased appetite.  Reports was unable to lay down last night due to SOB.  Daughter also reports pts sodium was low recently so they decreased her HCTZ.    Pt also c/o pain in left lower back radiating around to lower bad since last night.

## 2013-04-23 NOTE — H&P (Signed)
[facility] History and Physical  [place] ION:[id number] DOB: March 28, 1921 DOA: 04/23/2013  Referring physician: Bebe Hickman PCP: Brittany Flavin, MD  Specialists:  Cardiology  Chief Complaint: SOB/Mild confusion  HPI: [place] is a 79 y.o. female known history CAD status post CABG 1999, high functioning recent admission 04/07/2013 2 :1 heart block(Mobitz) with intermittent complete heart block status postMedtronic PPM 04/07/2013 admission, newly diagnosed atrial fibrillation, s/p cardioversion with subsequent reversion to A. Fib at that admission (on amiodarone/Elliquis)came to [facility] ed 04/23/2013 with progressive shortness of breath, Emergency physician called [place]any [place] who recommended that patient could stay at [facility] hospital.weeks patient states that she got up last night to go to the restroom at around 2 AM after fall asleep at 8 PM. She states that she fell significant shortness after that and could not go back to bed. She sat up in a recliner last night the whole night and did not get much restHer daughter Brittany Hickman came to the house this morning and she was noted to be more short of breath than usual, had increasing lower extremity swelling and came to the emergency room because of this. She is fairly high functional but is extremely hard of hearing-of her daughters live within 5 minutes of her. She ambulates with help of a walker. She does not drive. They help her with her ADLs and bring her food. She was told on discharge recently to increase her fluid intake and has been drinking 3 800 cc bottles of water since then. Since the pacemaker placed which is not really been eating. Has been taking her diuretic and all her medications religiously  Specifically denies = chest pain, fever, chills, diarrhea, dysuria, hematemesis, melena, rash,Blurred or double vision, weakness any one side of body, seizures  + Mild shortness of breath, paroxysmal nocturnal dyspnea-relieved by sitting  in a recliner  Social history significant for her being a nonsmoker, used to work in a [employer] as a younger lady Has 2 daughters-They both were unsure whether she is a full code vs. Not   No allergies noted    ED workup = troponin less than0.3 Chest x-ray enlarging pleural effusions bilaterally EKG  Sodium 120, chloride 87, BUN/creatinine 21/1.51 albumin 3.2 proBNP 6912   Review of Systems:   Past Medical History  Diagnosis Date  . CAD (coronary artery disease)     a. s/p 5 vessel CABG in 1999.  Marland Kitchen HTN (hypertension)   . Hyperlipidemia   . Osteoarthritis   . Osteoporosis   . Mobitz II     a. 04/2013: s/p MDT Sensia DC PPM model SEDR0 1 (ser # [id number]  . Atrial fibrillation     a. Dx 04/2013-->ibutilide cardioversion-->recurrent afib within hours-->eliquis initiated.   Past Surgical History  Procedure Laterality Date  . Cholecystectomy    . Bladder suspension    . Rectal prolapse repair    . Pacemaker insertion  04-08-2013    MDT SEDR01 implanted by [place]any [place] for Mobitz II AV block   Social History:  History   Social History Narrative   Patient lives in [place] herself.  She has daughters nearby that are very involved and do her grocery shopping and bring her to church.    No Known Allergies  Family History  Problem Relation Age of Onset  . Gastric cancer Father   . Stroke Mother   . Diabetes Sister   . Epilepsy    . Breast cancer      Prior  to Admission medications   Medication Sig Start Date End Date Taking? Authorizing Provider  amiodarone (PACERONE) 200 MG tablet Take 1 tablet (200 mg total) by mouth daily. 05/01/13  Yes Brittany T Alben Spittle, PA-C  amLODipine (NORVASC) 10 MG tablet Take 10 mg by mouth daily.   Yes Historical Provider, MD  apixaban (ELIQUIS) 2.5 MG TABS tablet Take 1 tablet (2.5 mg total) by mouth 2 (two) times daily. 04/10/13  Yes Brittany Anis, NP  CALCIUM PO Take 1 tablet by mouth daily.   Yes Historical Provider, MD   estradiol (ESTRACE) 0.1 MG/GM vaginal cream Place 1 Applicatorful vaginally once a week. On Saturdays   Yes Historical Provider, MD  hydrALAZINE (APRESOLINE) 25 MG tablet Take 1.5 tablets (37.5 mg total) by mouth every 8 (eight) hours. 04/17/13  Yes Brittany Moishe Spice, PA-C  hydrochlorothiazide (HYDRODIURIL) 25 MG tablet Take 0.5 tablets (12.5 mg total) by mouth daily. 04/18/13  Yes Brittany T Alben Spittle, PA-C  metoprolol (LOPRESSOR) 50 MG tablet Take 50 mg by mouth 2 (two) times daily.   Yes Historical Provider, MD  Multiple Vitamins-Minerals (CENTRUM SILVER ADULT 50+) TABS Take by mouth daily.   Yes Historical Provider, MD  nitroGLYCERIN (NITROSTAT) 0.4 MG SL tablet Place 1 tablet (0.4 mg total) under the tongue every 5 (five) minutes x 3 doses as needed for chest pain. 04/10/13  Yes Brittany Anis, NP  raloxifene (EVISTA) 60 MG tablet Take 60 mg by mouth daily.   Yes Historical Provider, MD   Physical Exam: Filed Vitals:   04/23/13 1103 04/23/13 1200 04/23/13 1403 04/23/13 1513  BP: 154/49 146/56 165/52 146/38  Pulse: 60 59 64 61  Temp: 98 F (36.7 C)  98.1 F (36.7 C) 97.9 F (36.6 C)  TempSrc: Oral  Oral Oral  Resp: 20 16 20 20   Height:   5\' 5"  (1.651 m)   Weight:   59.6 kg (131 lb 6.3 oz)   SpO2: 93%  93% 85%     General:  EOMI, NCAT, mild temporalis wasting, cataracts, no pallor, arcus senilis  Eyes: see above  ENT: soft supple no thyromegaly  Neck: JVD = 5 cm, no bruit  Cardiovascular: midline chest scar S1-S2 no rub or gallop  Respiratory: ventricles bilaterally posteriorly  Abdomen: soft nontender nondistended no rebound  Skin: grade 1-2 lower extremity pitting edema  Musculoskeletal: range of motion intact  Psychiatric: euthymic pleasant hard of her  Neurologic: grossly intact moving all 4 limbs equally  Labs on Admission:  Basic Metabolic Panel:  Recent Labs Lab 04/17/13 1624 04/23/13 0955  NA 127* 120*  K 4.2 3.6  CL 97 87*  CO2 23 20  GLUCOSE 126*  189*  BUN 24* 21  CREATININE 1.7* 1.51*  CALCIUM 8.5 8.4   Liver Function Tests:  Recent Labs Lab 04/23/13 0955  AST 21  ALT 16  ALKPHOS 55  BILITOT 0.7  PROT 6.1  ALBUMIN 3.2*   No results found for this basename: LIPASE, AMYLASE,  in the last 168 hours No results found for this basename: AMMONIA,  in the last 168 hours CBC:  Recent Labs Lab 04/17/13 1624 04/23/13 0955 04/23/13 1513  WBC 6.2 4.7 5.5  NEUTROABS 4.6 3.7  --   HGB 10.2* 10.0* 10.2*  HCT 30.1* 28.0* 28.4*  MCV 88.6 84.8 84.3  PLT 179.0 170 154   Cardiac Enzymes:  Recent Labs Lab 04/23/13 1042  TROPONINI <0.30    BNP (last 3 results)  Recent Labs  04/23/13 0955  PROBNP 6912.0*   CBG: No results found for this basename: GLUCAP,  in the last 168 hours  Radiological Exams on Admission: Dg Chest Portable 1 View  04/23/2013   CLINICAL DATA:  Shortness of breath, worsening.  EXAM: PORTABLE CHEST - 1 VIEW  COMPARISON:  04/09/2013.  FINDINGS: Trachea is midline. Heart is enlarged, stable. Pacemaker lead tips project over the right atrium and right ventricle. Thoracic aorta is calcified.  Biapical pleural parenchymal scarring. Bilateral pleural effusions with mild bibasilar airspace disease.  IMPRESSION: Enlarging bilateral pleural effusions with mild bibasilar airspace disease.   Electronically Signed   By: Leanna Battles M.D.   On: 04/23/2013 10:18    EKG: Independently reviewed. See above  Assessment/Plan Principal Problem:   AECHF, EF 60-65% 04/08/13-clinical evidence of volume overload, BNP elevated, pleural effusions, lower extremity edema, = right plus left sided heart failure-Lasix IV 40 given in emergency room, continue Lasix 20 mg IV every 12 hourly subsequently.  Daily weights, strict I/O, Foley-cardiology consult to help with medication management-probably may not benefit from hydralazine and amlodipine, both calcium channel blockers causing vasodilation, however realize limits of diuretic  therapy Active Problems:   Dilutional hyponatremia-has been drinking excess water.  Needs Aquaresis with IV Lasix >HCTZ. Restrict volume to 1200 cc.  Sodium will correct slowly, at this rate would not get urinary studies as clinically she has just increased volume.  If this worsens, will get nephrology help and assistance-Sodium deficit 568-as normal saline contains 154, will give IV saline 50 cc/hr to correct volume losses, and lasix to aquarese   Complete heart block, s/p PPM  04/2013-cardiology to comment   CAD s/p CABG x 4 vessel 1999-appropriately on beta blocker 50 mg bid.  Is is tapered to 12.5 twice a day until cardiology sees patient-I have held her hydralazine in addition to her Norvasc as this is therapeutic duplication and may be responsible for her lower extremity swelling   HTN (hypertension)-see above   Hyperlipidemia-not on a statin. LDL 12/2 = 60   Osteoporosis-currently on tamoxifen and estradiol vaginal cream. At her age, I think this is reasonable and we'll defer to PCP to make further changes   Atrial fibrillation, Chad2Vasc ~4-currently on amiodarone 200 once daily, Apixaban 2.5 twice a day-as is on amiodarone, TSH was checked 12/1 = 4.7   CKD (chronic kidney disease), stage III-monitor closely, renal panel a.m. Along with LFTs   Pulmonary HTN, 53mm hg-monitor   Long discussion with family members at bedside, Regan Rakers Daughter [phone] Inpatient 1-2 days Given she's had some confusion recently, get PT OT to see her for safety  Time spent: 68  Mahala Menghini [facility] Pager [phone]  If 7PM-7AM, please contact night-coverage [email or web address] Password [email or web address] 04/23/2013, 4:56 PM

## 2013-04-23 NOTE — Telephone Encounter (Signed)
New Problem:  Pt's daughter states the pt is SOB, weak and tired... Has been up since 2 am. Daughter wants to speak to a nurse

## 2013-04-23 NOTE — Consult Note (Addendum)
Consulting cardiologist: Antoine Poche  Clinical Summary Ms. Noy is a 79 y.o.female with history of complete heart block with recent Medtronic pacemaker placement 04/2013, new diagnosis of afib that admission with ibutilide cardioversion with subequent reversion back to afib, started on amiodarone and eliquis. She also has a history of CAD with prior 5 vessel CABG in 1999 and HTN. She was admitted with SOB and leg swelling. Patient reports several day history of worsening SOB, orthopnea, and LE edema. Denies any chest pain. No palpitations.   Pro-BNP 6912, Na 120, K 3.6, Cr 1.5, GFR 29, Hbg 10, Plt 170, trop neg,  CXR showed bilateral pleural effusions with mild bibasial airspace disease, EKG  AV pacing with normal function.    No Known Allergies  Medications Scheduled Medications: . amiodarone  200 mg Oral Daily  . apixaban  2.5 mg Oral BID  . furosemide  20 mg Intravenous Q12H  . metoprolol  12.5 mg Oral BID  . [START ON 04/24/2013] raloxifene  60 mg Oral Daily     Infusions: . sodium chloride 50 mL/hr at 04/23/13 1416     PRN Medications:     Past Medical History  Diagnosis Date  . CAD (coronary artery disease)     a. s/p 5 vessel CABG in 1999.  Marland Kitchen HTN (hypertension)   . Hyperlipidemia   . Osteoarthritis   . Osteoporosis   . Mobitz II     a. 04/2013: s/p MDT Sensia DC PPM model SEDR0 1 (ser # [id number]  . Atrial fibrillation     a. Dx 04/2013-->ibutilide cardioversion-->recurrent afib within hours-->eliquis initiated.    Past Surgical History  Procedure Laterality Date  . Cholecystectomy    . Bladder suspension    . Rectal prolapse repair    . Pacemaker insertion  04-08-2013    MDT SEDR01 implanted by Dr Johney Frame for Mobitz II AV block    Family History  Problem Relation Age of Onset  . Gastric cancer Father   . Stroke Mother   . Diabetes Sister   . Epilepsy    . Breast cancer      Social History Ms. Moronta reports that she has never smoked. She has  never used smokeless tobacco. Ms. Shiffman reports that she does not drink alcohol.  Review of Systems CONSTITUTIONAL: No weight loss, fever, chills, weakness or fatigue.  HEENT: Eyes: No visual loss, blurred vision, double vision or yellow sclerae. No hearing loss, sneezing, congestion, runny nose or sore throat.  SKIN: No rash or itching.  CARDIOVASCULAR: orthopnea RESPIRATORY: SOB, DOE GASTROINTESTINAL: No anorexia, nausea, vomiting or diarrhea. No abdominal pain or blood.  GENITOURINARY: no polyuria, no dysuria NEUROLOGICAL: No headache, dizziness, syncope, paralysis, ataxia, numbness or tingling in the extremities. No change in bowel or bladder control.  MUSCULOSKELETAL: No muscle, back pain, joint pain or stiffness.  HEMATOLOGIC: No anemia, bleeding or bruising.  LYMPHATICS: No enlarged nodes. No history of splenectomy.  PSYCHIATRIC: No history of depression or anxiety.      Physical Examination Blood pressure 165/52, pulse 64, temperature 98.1 F (36.7 C), temperature source Oral, resp. rate 20, height 5\' 5"  (1.651 m), weight 131 lb 6.3 oz (59.6 kg), SpO2 93.00%. No intake or output data in the 24 hours ending 04/23/13 1508   Cardiovascular: RRR, no m/r/g,  JVD to angle of jaw with + hepatojugular reflex  Respiratory: decreased breath sounds bilateral lung bases  GI: soft, NT, ND  MSK: extremities are warm, 3+ bilateral edema  Neuro: no  focal deficits  Psych: appropriate affect.    Lab Results  Basic Metabolic Panel:  Recent Labs Lab 04/17/13 1624 04/23/13 0955  NA 127* 120*  K 4.2 3.6  CL 97 87*  CO2 23 20  GLUCOSE 126* 189*  BUN 24* 21  CREATININE 1.7* 1.51*  CALCIUM 8.5 8.4    Liver Function Tests:  Recent Labs Lab 04/23/13 0955  AST 21  ALT 16  ALKPHOS 55  BILITOT 0.7  PROT 6.1  ALBUMIN 3.2*    CBC:  Recent Labs Lab 04/17/13 1624 04/23/13 0955  WBC 6.2 4.7  NEUTROABS 4.6 3.7  HGB 10.2* 10.0*  HCT 30.1* 28.0*  MCV 88.6 84.8  PLT  179.0 170    Cardiac Enzymes:  Recent Labs Lab 04/23/13 1042  TROPONINI <0.30    BNP: No components found with this basename: POCBNP,    ECG 04/23/13 AV pacing with normal function  Imaging 04/23/13 CXR IMPRESSION:  Enlarging bilateral pleural effusions with mild bibasilar airspace  disease.  04/08/13 Echo LVEF 60-65%, mild MR, mod TR, PASP 53 mmHg, no description of diastolic function     Impression/Recommendations 1. Volume overload - unclear etiology. In the setting of profound hypervolemic hyponatremia there is a  strong suspicion for hypothryoidism as a possible etiology. Patient had elevated TSH 04/07/13 prior to being started on amio, she may have had underlying hypothyroidism exacerbated by amiodarone use.  - normal LV and RV systolic function by echo 04/08/13, diastolic function is not described. Doubt that volume overload and pleural effusion are cardiac in etiology.  - added on TSH and free T4 to previously drawn labs, follow up results. Will hold amiodarone for the time being. If signigicant hypothyroidism on labs consider repeat limited echo to evaluate LV and RV systolic function in the AM.   2. Heart block - normal pacemaker function by EKG and telemetry monitoring - maintain regular outpatient EP follow up  3. CAD - no current chest pain - continue current medications  4. Afib - patient currently AV paced - will hold amiodarone, continue metoprolol and apixiban.   5. Hyponatremia - severe hypervolemic hyponatremia, workup and correction per primary team.    Dina Rich, M.D., F.A.C.C.

## 2013-04-23 NOTE — Progress Notes (Deleted)
[facility] History and Physical  [place] WUJ:[id number] DOB: [birth year] DOA: 04/23/2013  Referring physician: Bebe Shaggy PCP: Selinda Flavin, MD  Specialists:  Cardiology  Chief Complaint: SOB/Mild confusion  HPI: Brittany Hickman is a 79 y.o. female known history CAD status post CABG 1999, high functioning recent admission 04/07/2013 2 :1 heart block(Mobitz) with intermittent complete heart block status postMedtronic PPM 04/07/2013 admission, newly diagnosed atrial fibrillation, s/p cardioversion with subsequent reversion to A. Fib at that admission (on amiodarone/Elliquis)came to [facility] ed 04/23/2013 with progressive shortness of breath, Emergency physician called Dr. Patty Sermons who recommended that patient could stay at [facility] hospital.weeks patient states that she got up last night to go to the restroom at around 2 AM after fall asleep at 8 PM. She states that she fell significant shortness after that and could not go back to bed. She sat up in a recliner last night the whole night and did not get much restHer daughter Brittany Hickman came to the house this morning and she was noted to be more short of breath than usual, had increasing lower extremity swelling and came to the emergency room because of this. She is fairly high functional but is extremely hard of hearing-of her daughters live within 5 minutes of her. She ambulates with help of a walker. She does not drive. They help her with her ADLs and bring her food. She was told on discharge recently to increase her fluid intake and has been drinking 3 800 cc bottles of water since then. Since the pacemaker placed which is not really been eating. Has been taking her diuretic and all her medications religiously  Specifically denies = chest pain, fever, chills, diarrhea, dysuria, hematemesis, melena, rash,Blurred or double vision, weakness any one side of body, seizures  + Mild shortness of breath, paroxysmal nocturnal dyspnea-relieved by sitting  in a recliner  Social history significant for her being a nonsmoker, used to work in a [employer] as a younger lady Has 2 daughters-They both were unsure whether she is a full code vs. Not   No allergies noted    ED workup = troponin less than0.3 Chest x-ray enlarging pleural effusions bilaterally EKG  Sodium 120, chloride 87, BUN/creatinine 21/1.51 albumin 3.2 proBNP 6912   Review of Systems:   Past Medical History  Diagnosis Date  . CAD (coronary artery disease)     a. s/p 5 vessel CABG in 1999.  Marland Kitchen HTN (hypertension)   . Hyperlipidemia   . Osteoarthritis   . Osteoporosis   . Mobitz II     a. 04/2013: s/p MDT Sensia DC PPM model SEDR0 1 (ser # [id number]  . Atrial fibrillation     a. Dx 04/2013-->ibutilide cardioversion-->recurrent afib within hours-->eliquis initiated.   Past Surgical History  Procedure Laterality Date  . Cholecystectomy    . Bladder suspension    . Rectal prolapse repair    . Pacemaker insertion  04-08-2013    MDT SEDR01 implanted by Dr Johney Frame for Mobitz II AV block   Social History:  History   Social History Narrative   Patient lives in [place] by herself.  She has daughters nearby that are very involved and do her grocery shopping and bring her to church.    No Known Allergies  Family History  Problem Relation Age of Onset  . Gastric cancer Father   . Stroke Mother   . Diabetes Sister   . Epilepsy    . Breast cancer      Prior  to Admission medications   Medication Sig Start Date End Date Taking? Authorizing Provider  amiodarone (PACERONE) 200 MG tablet Take 1 tablet (200 mg total) by mouth daily. 05/01/13  Yes Scott T Alben Spittle, PA-C  amLODipine (NORVASC) 10 MG tablet Take 10 mg by mouth daily.   Yes Historical Provider, MD  apixaban (ELIQUIS) 2.5 MG TABS tablet Take 1 tablet (2.5 mg total) by mouth 2 (two) times daily. 04/10/13  Yes Ok Anis, NP  CALCIUM PO Take 1 tablet by mouth daily.   Yes Historical Provider, MD   estradiol (ESTRACE) 0.1 MG/GM vaginal cream Place 1 Applicatorful vaginally once a week. On Saturdays   Yes Historical Provider, MD  hydrALAZINE (APRESOLINE) 25 MG tablet Take 1.5 tablets (37.5 mg total) by mouth every 8 (eight) hours. 04/17/13  Yes Scott Moishe Spice, PA-C  hydrochlorothiazide (HYDRODIURIL) 25 MG tablet Take 0.5 tablets (12.5 mg total) by mouth daily. 04/18/13  Yes Scott T Alben Spittle, PA-C  metoprolol (LOPRESSOR) 50 MG tablet Take 50 mg by mouth 2 (two) times daily.   Yes Historical Provider, MD  Multiple Vitamins-Minerals (CENTRUM SILVER ADULT 50+) TABS Take by mouth daily.   Yes Historical Provider, MD  nitroGLYCERIN (NITROSTAT) 0.4 MG SL tablet Place 1 tablet (0.4 mg total) under the tongue every 5 (five) minutes x 3 doses as needed for chest pain. 04/10/13  Yes Ok Anis, NP  raloxifene (EVISTA) 60 MG tablet Take 60 mg by mouth daily.   Yes Historical Provider, MD   Physical Exam: Filed Vitals:   04/23/13 0947 04/23/13 1103  BP: 147/50 154/49  Pulse: 62 60  Temp: 97.7 F (36.5 C) 98 F (36.7 C)  TempSrc: Oral Oral  Resp: 18 20  Height: 5\' 5"  (1.651 m)   Weight: 56.7 kg (125 lb)   SpO2: 95% 93%     General:  EOMI, NCAT, mild temporalis wasting, cataracts, no pallor, arcus senilis  Eyes: see above  ENT: soft supple no thyromegaly  Neck: JVD = 5 cm, no bruit  Cardiovascular: midline chest scar S1-S2 no rub or gallop  Respiratory: ventricles bilaterally posteriorly  Abdomen: soft nontender nondistended no rebound  Skin: grade 1-2 lower extremity pitting edema  Musculoskeletal: range of motion intact  Psychiatric: euthymic pleasant hard of her  Neurologic: grossly intact moving all 4 limbs equally  Labs on Admission:  Basic Metabolic Panel:  Recent Labs Lab 04/17/13 1624 04/23/13 0955  NA 127* 120*  K 4.2 3.6  CL 97 87*  CO2 23 20  GLUCOSE 126* 189*  BUN 24* 21  CREATININE 1.7* 1.51*  CALCIUM 8.5 8.4   Liver Function Tests:  Recent  Labs Lab 04/23/13 0955  AST 21  ALT 16  ALKPHOS 55  BILITOT 0.7  PROT 6.1  ALBUMIN 3.2*   No results found for this basename: LIPASE, AMYLASE,  in the last 168 hours No results found for this basename: AMMONIA,  in the last 168 hours CBC:  Recent Labs Lab 04/17/13 1624 04/23/13 0955  WBC 6.2 4.7  NEUTROABS 4.6 3.7  HGB 10.2* 10.0*  HCT 30.1* 28.0*  MCV 88.6 84.8  PLT 179.0 170   Cardiac Enzymes:  Recent Labs Lab 04/23/13 1042  TROPONINI <0.30    BNP (last 3 results)  Recent Labs  04/23/13 0955  PROBNP 6912.0*   CBG: No results found for this basename: GLUCAP,  in the last 168 hours  Radiological Exams on Admission: Dg Chest Portable 1 View  04/23/2013   CLINICAL DATA:  Shortness of breath, worsening.  EXAM: PORTABLE CHEST - 1 VIEW  COMPARISON:  04/09/2013.  FINDINGS: Trachea is midline. Heart is enlarged, stable. Pacemaker lead tips project over the right atrium and right ventricle. Thoracic aorta is calcified.  Biapical pleural parenchymal scarring. Bilateral pleural effusions with mild bibasilar airspace disease.  IMPRESSION: Enlarging bilateral pleural effusions with mild bibasilar airspace disease.   Electronically Signed   By: Leanna Battles M.D.   On: 04/23/2013 10:18    EKG: Independently reviewed. See above  Assessment/Plan Principal Problem:   AECHF, EF 60-65% 04/08/13-clinical evidence of volume overload, BNP elevated, pleural effusions, lower extremity edema, = right plus left sided heart failure-Lasix IV 40 given in emergency room, continue Lasix 20 mg IV every 12 hourly subsequently.  Daily weights, strict I/O, Foley-cardiology consult to help with medication management-probably may not benefit from hydralazine and amlodipine, both calcium channel blockers causing vasodilation, however realize limits of diuretic therapy Active Problems:   Dilutional hyponatremia-has been drinking excess water.  Needs Aquaresis with IV Lasix >HCTZ. Restrict volume to  1200 cc.  Sodium will correct slowly, at this rate would not get urinary studies as clinically she has just increased volume.  If this worsens, will get nephrology help and assistance-Sodium deficit 568-as normal saline contains 154, will give IV saline 50 cc/hr to correct volume losses, and lasix to aquarese   Complete heart block, s/p PPM  04/2013-cardiology to comment   CAD s/p CABG x 4 vessel 1999-appropriately on beta blocker 50 mg bid.  Is is tapered to 12.5 twice a day until cardiology sees patient-I have held her hydralazine in addition to her Norvasc as this is therapeutic duplication and may be responsible for her lower extremity swelling   HTN (hypertension)-see above   Hyperlipidemia-not on a statin. LDL 12/2 = 60   Osteoporosis-currently on tamoxifen and estradiol vaginal cream. At her age, I think this is reasonable and we'll defer to PCP to make further changes   Atrial fibrillation, Chad2Vasc ~4-currently on amiodarone 200 once daily, Apixaban 2.5 twice a day-as is on amiodarone, TSH was checked 12/1 = 4.7   CKD (chronic kidney disease), stage III-monitor closely, renal panel a.m. Along with LFTs   Pulmonary HTN, 53mm hg-monitor   Long discussion with family members at bedside, Regan Rakers Daughter [phone] Inpatient 1-2 days Given she's had some confusion recently, get PT OT to see her for safety  Time spent: 91  [facility] Pager [phone]  If 7PM-7AM, please contact night-coverage [email or web address] Password [email or web address] 04/23/2013, 11:51 AM

## 2013-04-23 NOTE — ED Provider Notes (Signed)
CSN: [id number]     Arrival date & time 04/23/13  7846 History  This chart was scribed for Joya Gaskins, MD by Caryn Bee, ED Scribe. This patient was seen in room APA18/APA18 and the patient's care was started 10:24 AM.    Chief Complaint  Patient presents with  . Shortness of Breath  . Leg Swelling   Patient is a 79 y.o. female presenting with shortness of breath. The history is provided by the patient and a relative. No language interpreter was used.  Shortness of Breath Severity:  Mild Onset quality:  Gradual Timing:  Intermittent Progression:  Waxing and waning Chronicity:  New Context comment:  Laying down Relieved by:  Sitting up Worsened by:  Nothing tried Ineffective treatments:  Sitting up and rest Associated symptoms: chest pain   Associated symptoms: no cough and no fever   Risk factors: no recent alcohol use, no obesity, no prolonged immobilization and no tobacco use    HPI Comments: Brittany Hickman is a 79 y.o. female who presents to the Emergency Department complaining of gradual onset intermittent weakness that began about 1 week ago. She reports occasional associated chest pain and bilateral leg swelling. Pt had pace maker placed earlier this month at [facility]. Pt reports not being able to sleep well, or lay down due to worsened SOB. Does not wear oxygen at baseline. Denies fever, cough, or any other symptoms. She is not a smoker.  Pt regularly takes prescription Eliquis. Pt's PCP is Dr. Selinda Flavin.    Past Medical History  Diagnosis Date  . CAD (coronary artery disease)     a. s/p 5 vessel CABG in 1999.  Marland Kitchen HTN (hypertension)   . Hyperlipidemia   . Osteoarthritis   . Osteoporosis   . Mobitz II     a. 04/2013: s/p MDT Sensia DC PPM model SEDR0 1 (ser # [id number]  . Atrial fibrillation     a. Dx 04/2013-->ibutilide cardioversion-->recurrent afib within hours-->eliquis initiated.   Past Surgical History  Procedure Laterality Date  .  Cholecystectomy    . Bladder suspension    . Rectal prolapse repair    . Pacemaker insertion  04-08-2013    MDT SEDR01 implanted by Dr Johney Frame for Mobitz II AV block   Family History  Problem Relation Age of Onset  . Gastric cancer Father   . Stroke Mother   . Diabetes Sister    History  Substance Use Topics  . Smoking status: Never Smoker   . Smokeless tobacco: Not on file  . Alcohol Use: No   OB History   Grav Para Term Preterm Abortions TAB SAB Ect Mult Living                 Review of Systems  Constitutional: Negative for fever.  Respiratory: Positive for shortness of breath. Negative for cough.   Cardiovascular: Positive for chest pain and leg swelling.       No active CP at this time   Neurological: Positive for weakness.  All other systems reviewed and are negative.    Allergies  Review of patient's allergies indicates no known allergies.  Home Medications   Current Outpatient Rx  Name  Route  Sig  Dispense  Refill  . amiodarone (PACERONE) 200 MG tablet   Oral   Take 1 tablet (200 mg total) by mouth daily.   30 tablet   6   . amLODipine (NORVASC) 10 MG tablet   Oral  Take 10 mg by mouth daily.         Marland Kitchen apixaban (ELIQUIS) 2.5 MG TABS tablet   Oral   Take 1 tablet (2.5 mg total) by mouth 2 (two) times daily.   60 tablet   6   . CALCIUM PO   Oral   Take 1 tablet by mouth daily.         Marland Kitchen estradiol (ESTRACE) 0.1 MG/GM vaginal cream   Vaginal   Place 1 Applicatorful vaginally once a week.         . hydrALAZINE (APRESOLINE) 25 MG tablet   Oral   Take 1.5 tablets (37.5 mg total) by mouth every 8 (eight) hours.   135 tablet   6   . hydrochlorothiazide (HYDRODIURIL) 25 MG tablet   Oral   Take 0.5 tablets (12.5 mg total) by mouth daily.         . metoprolol (LOPRESSOR) 50 MG tablet   Oral   Take 50 mg by mouth 2 (two) times daily.         . Multiple Vitamins-Minerals (CENTRUM SILVER ADULT 50+) TABS   Oral   Take by mouth daily.          . nitroGLYCERIN (NITROSTAT) 0.4 MG SL tablet   Sublingual   Place 1 tablet (0.4 mg total) under the tongue every 5 (five) minutes x 3 doses as needed for chest pain.   25 tablet   3   . raloxifene (EVISTA) 60 MG tablet   Oral   Take 60 mg by mouth daily.          BP 147/50  Pulse 62  Temp(Src) 97.7 F (36.5 C) (Oral)  Resp 18  Ht 5\' 5"  (1.651 m)  Wt 125 lb (56.7 kg)  BMI 20.80 kg/m2  SpO2 95%  Physical Exam  Nursing note and vitals reviewed. CONSTITUTIONAL: Well developed/well nourished HEAD: Normocephalic/atraumatic EYES: EOMI/PERRL ENMT: Mucous membranes moist NECK: supple no meningeal signs CV: no murmurs/rubs/gallops noted LUNGS: decreased breathe sounds bilaterally, no apparent distress ABDOMEN: soft, nontender, no rebound or guarding NEURO: Pt is awake/alert, moves all extremitiesx4 EXTREMITIES: pulses normal, full ROM, 2+ pitting edema in lower extremities SKIN: warm, color normal, No redness or drainage noted from pacemaker site. PSYCH: no abnormalities of mood noted   ED Course  Procedures (including critical care time) DIAGNOSTIC STUDIES: Oxygen Saturation is 95% on room air, adequate by my interpretation.    COORDINATION OF CARE: 10:33 AM-Discussed treatment plan with pt at bedside and pt agreed to plan.  10:51 AM D/w dr Patty Sermons Since pacemaker working, can be managed at [employer] with diuresis 11:51 AM D/w dr Mahala Menghini, will admit to tele One time dose of lasix ordered, and sodium will need monitoring   Labs Review Labs Reviewed  CBC WITH DIFFERENTIAL - Abnormal; Notable for the following:    RBC 3.30 (*)    Hemoglobin 10.0 (*)    HCT 28.0 (*)    Neutrophils Relative % 78 (*)    All other components within normal limits  COMPREHENSIVE METABOLIC PANEL - Abnormal; Notable for the following:    Sodium 120 (*)    Chloride 87 (*)    Glucose, Bld 189 (*)    Creatinine, Ser 1.51 (*)    Albumin 3.2 (*)    GFR calc non Af Amer 29 (*)     GFR calc Af Amer 34 (*)    All other components within normal limits  PRO B NATRIURETIC PEPTIDE -  Abnormal; Notable for the following:    Pro B Natriuretic peptide (BNP) 6912.0 (*)    All other components within normal limits  TROPONIN I   Imaging Review Dg Chest Portable 1 View  04/23/2013   CLINICAL DATA:  Shortness of breath, worsening.  EXAM: PORTABLE CHEST - 1 VIEW  COMPARISON:  04/09/2013.  FINDINGS: Trachea is midline. Heart is enlarged, stable. Pacemaker lead tips project over the right atrium and right ventricle. Thoracic aorta is calcified.  Biapical pleural parenchymal scarring. Bilateral pleural effusions with mild bibasilar airspace disease.  IMPRESSION: Enlarging bilateral pleural effusions with mild bibasilar airspace disease.   Electronically Signed   By: Leanna Battles M.D.   On: 04/23/2013 10:18    EKG Interpretation    Date/Time:  Wednesday April 23 2013 09:55:41 EST Ventricular Rate:  60 PR Interval:  150 QRS Duration: 164 QT Interval:  532 QTC Calculation: 532 R Axis:   -84 Text Interpretation:  AV dual-paced rhythm Abnormal ECG When compared with ECG of 09-Apr-2013 19:29, No significant change was found Confirmed by Bebe Shaggy  MD, Geneve Kimpel [phone]) on 04/23/2013 10:16:38 AM            MDM  No diagnosis found. Nursing notes including past medical history and social history reviewed and considered in documentation Labs/vital reviewed and considered xrays reviewed and considered Previous records reviewed and considered - recent pacemaker placement   I personally performed the services described in this documentation, which was scribed in my presence. The recorded information has been reviewed and is accurate.      Joya Gaskins, MD 04/23/13 [phone]

## 2013-04-23 NOTE — ED Notes (Signed)
Nurse unable to take report at this time.

## 2013-04-23 NOTE — Telephone Encounter (Signed)
Spoke with [name] Pt's daughter regarding pt. Daughter states pt has been weak since the pacemaker implant, pt  has poor appetite and some SOB, this morning pt's breathing started to be more labored she woke up about 2:00 AM because she could hardly breath. Pt's BP yesterday was 161/65 this morning is 168/61, HR 62 beats/minute, LE edema. Daughter states that pt's Hydralazine to 1 and 1/2 tablet  On the last office visit with scott weaver PA. Scott weaver [place] aware and recommended for pt to go to the ER to be evaluated, Pt's daughter aware and verbalized understanding. Daun Peacock aware of pt's arrival to the ER.

## 2013-04-24 DIAGNOSIS — R609 Edema, unspecified: Secondary | ICD-10-CM

## 2013-04-24 DIAGNOSIS — I442 Atrioventricular block, complete: Secondary | ICD-10-CM

## 2013-04-24 DIAGNOSIS — E236 Other disorders of pituitary gland: Secondary | ICD-10-CM

## 2013-04-24 DIAGNOSIS — I2789 Other specified pulmonary heart diseases: Secondary | ICD-10-CM

## 2013-04-24 DIAGNOSIS — E785 Hyperlipidemia, unspecified: Secondary | ICD-10-CM

## 2013-04-24 DIAGNOSIS — I10 Essential (primary) hypertension: Secondary | ICD-10-CM

## 2013-04-24 DIAGNOSIS — Z951 Presence of aortocoronary bypass graft: Secondary | ICD-10-CM

## 2013-04-24 DIAGNOSIS — I441 Atrioventricular block, second degree: Secondary | ICD-10-CM

## 2013-04-24 LAB — POCT I-STAT, CHEM 8
BUN: 19 mg/dL (ref 6–23)
Calcium, Ion: 1.07 mmol/L — ABNORMAL LOW (ref 1.13–1.30)
Chloride: 90 mEq/L — ABNORMAL LOW (ref 96–112)
Creatinine, Ser: 1.6 mg/dL — ABNORMAL HIGH (ref 0.50–1.10)
Glucose, Bld: 190 mg/dL — ABNORMAL HIGH (ref 70–99)
HCT: 30 % — ABNORMAL LOW (ref 36.0–46.0)
Potassium: 3.7 mEq/L (ref 3.5–5.1)
TCO2: 18 mmol/L (ref 0–100)

## 2013-04-24 LAB — COMPREHENSIVE METABOLIC PANEL
ALT: 15 U/L (ref 0–35)
AST: 22 U/L (ref 0–37)
Albumin: 3.2 g/dL — ABNORMAL LOW (ref 3.5–5.2)
Alkaline Phosphatase: 52 U/L (ref 39–117)
CO2: 20 mEq/L (ref 19–32)
Calcium: 8.3 mg/dL — ABNORMAL LOW (ref 8.4–10.5)
Creatinine, Ser: 1.51 mg/dL — ABNORMAL HIGH (ref 0.50–1.10)
GFR calc Af Amer: 34 mL/min — ABNORMAL LOW (ref >90)
GFR calc non Af Amer: 29 mL/min — ABNORMAL LOW (ref >90)
Glucose, Bld: 106 mg/dL — ABNORMAL HIGH (ref 70–99)
Sodium: 121 mEq/L — ABNORMAL LOW (ref 135–145)
Total Bilirubin: 0.7 mg/dL (ref 0.3–1.2)
Total Protein: 6.1 g/dL (ref 6.0–8.3)

## 2013-04-24 LAB — SODIUM, URINE, RANDOM: Sodium, Ur: 68 mEq/L

## 2013-04-24 MED ORDER — LABETALOL HCL 200 MG PO TABS
100.0000 mg | ORAL_TABLET | Freq: Two times a day (BID) | ORAL | Status: DC
  Administered 2013-04-24 – 2013-04-26 (×4): 100 mg via ORAL
  Filled 2013-04-24 (×4): qty 1

## 2013-04-24 MED ORDER — FUROSEMIDE 10 MG/ML IJ SOLN
40.0000 mg | Freq: Two times a day (BID) | INTRAMUSCULAR | Status: DC
  Administered 2013-04-24 – 2013-04-25 (×2): 40 mg via INTRAVENOUS
  Filled 2013-04-24 (×2): qty 4

## 2013-04-24 NOTE — Progress Notes (Signed)
SUBJECTIVE: Patient says she feels much better, and her breathing in particular is much better.     Intake/Output Summary (Last 24 hours) at 04/24/13 1608 Last data filed at 04/24/13 1300  Gross per 24 hour  Intake    480 ml  Output   2650 ml  Net  -2170 ml    Current Facility-Administered Medications  Medication Dose Route Frequency Provider Last Rate Last Dose  . 0.9 %  sodium chloride infusion   Intravenous Continuous Rhetta Mura, MD 50 mL/hr at 04/24/13 1056    . apixaban (ELIQUIS) tablet 2.5 mg  2.5 mg Oral BID Rhetta Mura, MD   2.5 mg at 04/24/13 1053  . furosemide (LASIX) injection 40 mg  40 mg Intravenous Q12H Rhetta Mura, MD      . metoprolol tartrate (LOPRESSOR) tablet 12.5 mg  12.5 mg Oral BID Rhetta Mura, MD   12.5 mg at 04/24/13 1053  . raloxifene (EVISTA) tablet 60 mg  60 mg Oral Daily Rhetta Mura, MD   60 mg at 04/24/13 1053    Filed Vitals:   04/23/13 1513 04/23/13 2129 04/24/13 0513 04/24/13 1452  BP: 146/38 142/49 138/55 160/76  Pulse: 61 61 63 68  Temp: 97.9 F (36.6 C) 98 F (36.7 C) 97.8 F (36.6 C) 98.7 F (37.1 C)  TempSrc: Oral Oral Oral Oral  Resp: 20 20 18 18   Height:      Weight:      SpO2: 85% 98% 92% 92%    PHYSICAL EXAM General: NAD Neck: No JVD, no thyromegaly or thyroid nodule.  Lungs: Diminished breath sounds bilaterally, in particular at the bases, with normal respiratory effort. CV: Nondisplaced PMI.  Regular rhythm, normal S1/S2, no S3/S4, soft I/VI holosystolic murmur along left sternal border.  No pretibial edema.  No carotid bruit.  Normal pedal pulses.  Abdomen: Soft, nontender, no hepatosplenomegaly, no distention.  Neurologic: Alert and oriented x 3.  Psych: Normal affect. Extremities: No clubbing or cyanosis.   TELEMETRY: Reviewed telemetry pt in paced rhythm, 70 bpm range.  LABS: Basic Metabolic Panel:  Recent Labs  16/10/96 0955 04/23/13 1001 04/24/13 0508  NA  120* 122* 121*  K 3.6 3.7 3.2*  CL 87* 90* 86*  CO2 20  --  20  GLUCOSE 189* 190* 106*  BUN 21 19 20   CREATININE 1.51* 1.60* 1.51*  CALCIUM 8.4  --  8.3*   Liver Function Tests:  Recent Labs  04/23/13 0955 04/24/13 0508  AST 21 22  ALT 16 15  ALKPHOS 55 52  BILITOT 0.7 0.7  PROT 6.1 6.1  ALBUMIN 3.2* 3.2*   No results found for this basename: LIPASE, AMYLASE,  in the last 72 hours CBC:  Recent Labs  04/23/13 0955 04/23/13 1001 04/23/13 1513  WBC 4.7  --  5.5  NEUTROABS 3.7  --   --   HGB 10.0* 10.2* 10.2*  HCT 28.0* 30.0* 28.4*  MCV 84.8  --  84.3  PLT 170  --  154   Cardiac Enzymes:  Recent Labs  04/23/13 1042  TROPONINI <0.30   BNP: No components found with this basename: POCBNP,  D-Dimer: No results found for this basename: DDIMER,  in the last 72 hours Hemoglobin A1C: No results found for this basename: HGBA1C,  in the last 72 hours Fasting Lipid Panel: No results found for this basename: CHOL, HDL, LDLCALC, TRIG, CHOLHDL, LDLDIRECT,  in the last 72 hours Thyroid Function Tests:  Recent Labs  04/23/13 0925  TSH 3.468   Anemia Panel: No results found for this basename: VITAMINB12, FOLATE, FERRITIN, TIBC, IRON, RETICCTPCT,  in the last 72 hours  RADIOLOGY: Dg Chest 2 View  04/09/2013   CLINICAL DATA:  Post pacemaker insertion  EXAM: CHEST  2 VIEW  COMPARISON:  Prior radiograph from 04/07/2013  FINDINGS: There has been interval placement of a left-sided dual lead transvenous pacemaker/AICD with electrodes overlying the right atrium and right ventricle. Sequelae of prior CABG again noted. Transverse heart size is stable in size and contour, and remain is within normal limits.  Lungs are mildly hypoinflated. There has been interval development of diffuse pulmonary vascular congestion, new as compared to prior. Small bilateral pleural effusions are suspected. No pneumothorax identified.  Osseous structures are unchanged.  IMPRESSION: 1. Interval placement  of dual lead left-sided transvenous pacemaker/AICD without complication. No pneumothorax. 2. Interval development of mild diffuse pulmonary vascular congestion with small bilateral pleural effusions.   Electronically Signed   By: Rise Mu M.D.   On: 04/09/2013 06:52   Dg Chest Portable 1 View  04/23/2013   CLINICAL DATA:  Shortness of breath, worsening.  EXAM: PORTABLE CHEST - 1 VIEW  COMPARISON:  04/09/2013.  FINDINGS: Trachea is midline. Heart is enlarged, stable. Pacemaker lead tips project over the right atrium and right ventricle. Thoracic aorta is calcified.  Biapical pleural parenchymal scarring. Bilateral pleural effusions with mild bibasilar airspace disease.  IMPRESSION: Enlarging bilateral pleural effusions with mild bibasilar airspace disease.   Electronically Signed   By: Leanna Battles M.D.   On: 04/23/2013 10:18      ASSESSMENT AND PLAN: 1. Bilateral pleural effusions and leg edema: the leg edema appears to have resolved and she has improved symptomatically from a respiratory standpoint. Her TSH was normal. Her echo revealed normal systolic function with no comments made with respect to diastolic function. She had been on Lasix 20 mg IV bid which was increased today to 40 mg IV bid. Na marginal at 121. It is still possible she developed a decompensation from a diastolic standpoint, given her significant hypertension. Her BP is elevated. She had known recalcitrant hypertension as per previous notes. Hopefully, will be able to transition to oral diuretics tomorrow. She had been on HCTZ (along with ARB) for HTN, but these were stopped due to worsening renal function. Her amlodipine and hydralazine were also stopped on admission due to leg edema. I will switch metoprolol to labetalol for better BP control. Given the problems with controlling HTN in the past, along with this present admission for pleural effusions and leg edema, one may have to consider restarting HCTZ with continued  close monitoring of renal function.  2. Symptomatic bradycardia and high-grade AV block s/p pacemaker: telemetry reveals a paced rhythm. As such, it appears to be functioning appropriately.  3. CAD s/p CABG: she appears to be symptomatically stable from this standpoint. She denies chest pain. LV systolic function is normal as is regional wall motion.  4. Atrial fibrillation: amiodarone has been held. She remains on Eliquis. I will switch metoprolol to labetalol.  5. HTN: still suboptimally controlled, with numerous medication adjustments in the past several weeks. As stated previously, will transition to labetalol. I would still consider restarting hydralazine if BP remains difficult to control.  6. Hypervolemic hyponatremia: I suspect this will improve with continued diuresis. Fluid restriction has been emphasized.  7. Pulmonary hypertension: 53 mmHg by most recent echo. Leg edema has since resolved with diuresis.  Kate Sable, M.D., F.A.C.C.

## 2013-04-24 NOTE — Progress Notes (Signed)
Brittany Hickman:[id number] DOB: 15-Dec-1920 DOA: 04/23/2013 PCP: Selinda Flavin, MD  Brief narrative:  79 y.o. female known history CAD status post CABG 1999, high functioning recent admission 04/07/2013 2 :1 heart block([place]) with intermittent complete heart block status postMedtronic PPM 04/07/2013 admission, newly diagnosed atrial fibrillation, s/p cardioversion with subsequent reversion to A. Fib at that admission (on amiodarone/Elliquis)came to [facility] ed 04/23/2013 with progressive shortness of breath She initially exhibited significant shortness of breath and some confusion and her daughter is brought to her to the hospital   Past medical history-As per Problem list Chart reviewed as below- Reviewed  Consultants:  None as yet  Procedures:  Chest x-ray  Antibiotics:  None   Subjective  Looking well, feeling well, ambulated, Not as short of breath-lower extremity seem less swollen. Wearing stockings Tolerating diet, past large stool no dark nonbloody   Objective    Interim History:  telemetry:  paced, PVCs  Objective: Filed Vitals:   04/23/13 1403 04/23/13 1513 04/23/13 2129 04/24/13 0513  BP: 165/52 146/38 142/49 138/55  Pulse: 64 61 61 63  Temp: 98.1 F (36.7 C) 97.9 F (36.6 C) 98 F (36.7 C) 97.8 F (36.6 C)  TempSrc: Oral Oral Oral Oral  Resp: 20 20 20 18   Height: 5\' 5"  (1.651 m)     Weight: 59.6 kg (131 lb 6.3 oz)     SpO2: 93% 85% 98% 92%    Intake/Output Summary (Last 24 hours) at 04/24/13 1300 Last data filed at 04/24/13 0951  Gross per 24 hour  Intake    360 ml  Output   2400 ml  Net  -2040 ml    Exam:  General:  EOMI, NCAT Body mass index is 21.87 kg/(m^2).  Cardiovascular:  S1-S2 regular rate rhythm Respiratory:  clinically clear Abdomen:  soft nontender nondistended Skin no lower extremity edema Neuro very hard of hearing, neuro intact  Data Reviewed: Basic Metabolic Panel:  Recent Labs Lab 04/17/13 1624 04/23/13 0955  04/23/13 1001 04/24/13 0508  NA 127* 120* 122* 121*  K 4.2 3.6 3.7 3.2*  CL 97 87* 90* 86*  CO2 23 20  --  20  GLUCOSE 126* 189* 190* 106*  BUN 24* 21 19 20   CREATININE 1.7* 1.51* 1.60* 1.51*  CALCIUM 8.5 8.4  --  8.3*   Liver Function Tests:  Recent Labs Lab 04/23/13 0955 04/24/13 0508  AST 21 22  ALT 16 15  ALKPHOS 55 52  BILITOT 0.7 0.7  PROT 6.1 6.1  ALBUMIN 3.2* 3.2*   No results found for this basename: LIPASE, AMYLASE,  in the last 168 hours No results found for this basename: AMMONIA,  in the last 168 hours CBC:  Recent Labs Lab 04/17/13 1624 04/23/13 0955 04/23/13 1001 04/23/13 1513  WBC 6.2 4.7  --  5.5  NEUTROABS 4.6 3.7  --   --   HGB 10.2* 10.0* 10.2* 10.2*  HCT 30.1* 28.0* 30.0* 28.4*  MCV 88.6 84.8  --  84.3  PLT 179.0 170  --  154   Cardiac Enzymes:  Recent Labs Lab 04/23/13 1042  TROPONINI <0.30   BNP: No components found with this basename: POCBNP,  CBG: No results found for this basename: GLUCAP,  in the last 168 hours  No results found for this or any previous visit (from the past 240 hour(s)).   Studies:              All Imaging reviewed and is as per above notation  Scheduled Meds: . apixaban  2.5 mg Oral BID  . furosemide  20 mg Intravenous Q12H  . metoprolol  12.5 mg Oral BID  . raloxifene  60 mg Oral Daily   Continuous Infusions: . sodium chloride 50 mL/hr at 04/24/13 1056      Assessment/Plan     AECHF, EF 60-65% 04/08/13-clinical evidence of volume overload, BNP elevated, pleural effusions, lower extremity edema, = right plus left sided heart failure-Lasix IV 40 given in emergency room, continue Lasix 20 mg IV every 12 hourly subsequently. Daily weights, strict I/O, Foley-cardiology consult to help with medication management-currently on metoprolol tartrate 12.5 twice a day  Active Problems:  Dilutional hyponatremia-has been drinking excess water. Needs Aquaresis with IV Lasix >HCTZ. Restrict volume to 1200 cc.   nursing requested to remove sources of water from the room .consult placed for nephrology to assess patient 12/19 a.m.  continue IV saline 50 cc/hr to correct volume losses, and lasix to aquarese -dosage of Lasix increased to 40 mg every 12 hourly -obtain urinary osmolality, sodium, serum osmolality Complete heart block, s/p PPM 04/2013-cardiology to comment  CAD s/p CABG x 4 vessel 1999-appropriately on beta blocker 50 mg bid. Is is tapered to 12.5 twice a day until cardiology sees patient-I have held her hydralazine in addition to her Norvasc as this is therapeutic duplication and may be responsible for her lower extremity swelling  HTN (hypertension)-see above  Hyperlipidemia-not on a statin. LDL 12/2 = 60  Osteoporosis-currently on tamoxifen and estradiol vaginal cream. At her age, I think this is reasonable and we'll defer to PCP to make further changes  Atrial fibrillation, Chad2Vasc ~4-currently on amiodarone 200 once daily, Apixaban 2.5 twice a day-as is on amiodarone, TSH was checked 12/1 = 4.7- LFTs are normal  Mild hypothyroidism- likely sick euthyroid vs. diurnal variation on admission-please do not order further thyroid lab testing unless monitoring amiodarone toxicity  CKD (chronic kidney disease), stage III-monitor closely, renal panel a.m. Along with LFTs  Pulmonary HTN, 53mm hg-monitor   Code Status:  Full Family Communication:  15 minute discussion with daughter at bedside  Disposition Plan:  inpatient    Pleas Koch, MD  [facility] Pager [phone] 04/24/2013, 1:00 PM    LOS: 1 day

## 2013-04-24 NOTE — Progress Notes (Signed)
UR completed. Patient changed to inpatient r/t requiring IV lasix scheduled.  

## 2013-04-24 NOTE — Care Management Note (Signed)
    Page 1 of 1   04/24/2013     11:55:10 AM   CARE MANAGEMENT NOTE 04/24/2013  Patient:  Brittany Hickman, Brittany Hickman   Account Number:  [id number]  Date Initiated:  04/24/2013  Documentation initiated by:  Sharrie Rothman  Subjective/Objective Assessment:   Pt admitted from home with CHF. Pt lives alone but has 2 daughter and other family that is in the home daily. Pt has a cane and walker for home use but is fairly independent with ADL's.     Action/Plan:   Pt would like BSC as recommended by PT. Family denies any need for [facility] at this time.   Anticipated DC Date:  04/25/2013   Anticipated DC Plan:  HOME/SELF CARE      DC Planning Services  CM consult      PAC Choice  DURABLE MEDICAL EQUIPMENT   Choice offered to / List presented to:  C-4 Adult Children   DME arranged  BEDSIDE COMMODE      DME agency  [facility]        Status of service:  Completed, signed off Medicare Important Message given?   (If response is "NO", the following Medicare IM given date fields will be blank) Date Medicare IM given:   Date Additional Medicare IM given:    Discharge Disposition:  HOME/SELF CARE  Per UR Regulation:    If discussed at Long Length of Stay Meetings, dates discussed:    Comments:  04/24/13 1155 Arlyss Queen, RN BSN CM

## 2013-04-24 NOTE — Evaluation (Signed)
Physical Therapy Evaluation Patient Details Name: Brittany Hickman MRN: [id number] DOB: 1920-06-28 Today's Date: 04/24/2013 Time: 4098-1191 PT Time Calculation (min): 31 min  PT Assessment / Plan / Recommendation History of Present Illness  Pt is admitted due to increased dyspnea and LE edema.  She recently had a pacemaker placed and daughter reports that she has been somewhat weak since this procedure.  Pt lives alone with daughters very close by.  They visit frequently during the day, every day and manage many household activities for her.  She is generally independent in gait with a walker.  Clinical Impression   Pt was seen for evaluation..  She is functioning at prior level, daughter feels that she is much better than after having pacemaker.  She seems to be receiving excellent support at home and should not need any further PT.    PT Assessment  Patent does not need any further PT services    Follow Up Recommendations      Does the patient have the potential to tolerate intense rehabilitation      Barriers to Discharge     Equipment Recommendations  None recommended by PT    Recommendations for Other Services  (this is in error)   Frequency  (this is in error)    Precautions / Restrictions Precautions Precautions: Fall Restrictions Weight Bearing Restrictions: No   Pertinent Vitals/Pain       Mobility  Bed Mobility Bed Mobility: Not assessed Transfers Sit to Stand: 5: Supervision;With upper extremity assist;From bed Stand to Sit: 5: Supervision;With upper extremity assist;To chair/3-in-1 Ambulation/Gait Ambulation/Gait Assistance: 5: Supervision Ambulation Distance (Feet): 175 Feet Assistive device: Rolling walker Ambulation/Gait Assistance Details: cues for positioning walker Gait Pattern: Step-through pattern;Trunk flexed Gait velocity: WNL Stairs: No Wheelchair Mobility Wheelchair Mobility: No    Exercises     PT Diagnosis:   PT Problem List:  PT  Treatment Interventions:      PT Goals(Current goals can be found in the care plan section) Acute Rehab PT Goals Patient Stated Goal: none stated PT Goal Formulation: No goals set, d/c therapy Time For Goal Achievement:   Visit Information  Last PT Received On: 04/24/13 History of Present Illness: Pt is admitted due to increased dyspnea and LE edema.  She recently had a pacemaker placed and daughter reports that she has been somewhat weak since this procedure.  Pt lives alone with daughters very close by.  They visit frequently during the day, every day and manage many household activities for her.  She is generally independent in gait with a walker.       Prior Functioning  Home Living Family/patient expects to be discharged to:: Private residence Living Arrangements: Alone    Cognition  Cognition Arousal/Alertness: Awake/alert Behavior During Therapy: WFL for tasks assessed/performed Overall Cognitive Status: Within Functional Limits for tasks assessed    Extremity/Trunk Assessment Lower Extremity Assessment Lower Extremity Assessment: Overall WFL for tasks assessed   Balance Balance Balance Assessed: Yes Static Standing Balance Static Standing - Balance Support: No upper extremity supported Static Standing - Level of Assistance: 5: Stand by assistance Dynamic Standing Balance Dynamic Standing - Balance Support: No upper extremity supported Dynamic Standing - Level of Assistance: 4: Min assist (falls backward)  End of Session PT - End of Session Equipment Utilized During Treatment: Gait belt Activity Tolerance: Patient tolerated treatment well Patient left: in chair;with call bell/phone within reach;with family/visitor present Nurse Communication: Mobility status  GP Functional Assessment Tool Used: clinical judgement Functional Limitation:  Mobility: Walking and moving around Mobility: Walking and Moving Around Current Status [phone]): At least 1 percent but less than 20  percent impaired, limited or restricted Mobility: Walking and Moving Around Goal Status [phone]): At least 1 percent but less than 20 percent impaired, limited or restricted Mobility: Walking and Moving Around Discharge Status [phone]): At least 1 percent but less than 20 percent impaired, limited or restricted   Konrad Penta 04/24/2013, 11:31 AM
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
## 2013-04-24 NOTE — Plan of Care (Signed)
Problem: Acute Rehab PT Goals(only PT should resolve) Goal: Pt Will Ambulate delete

## 2013-04-25 ENCOUNTER — Inpatient Hospital Stay (HOSPITAL_COMMUNITY): Payer: Medicare Other

## 2013-04-25 LAB — RENAL FUNCTION PANEL
CO2: 25 mEq/L (ref 19–32)
Calcium: 7.8 mg/dL — ABNORMAL LOW (ref 8.4–10.5)
GFR calc Af Amer: 36 mL/min — ABNORMAL LOW (ref >90)
GFR calc non Af Amer: 31 mL/min — ABNORMAL LOW (ref >90)
Glucose, Bld: 99 mg/dL (ref 70–99)
Phosphorus: 3.6 mg/dL (ref 2.3–4.6)
Potassium: 3.2 mEq/L — ABNORMAL LOW (ref 3.5–5.1)
Sodium: 128 mEq/L — ABNORMAL LOW (ref 135–145)

## 2013-04-25 LAB — OSMOLALITY, URINE: Osmolality, Ur: 265 mOsm/kg — ABNORMAL LOW (ref 390–1090)

## 2013-04-25 MED ORDER — POTASSIUM CHLORIDE 2 MEQ/ML IV SOLN
INTRAVENOUS | Status: DC
  Administered 2013-04-25: 10:00:00 via INTRAVENOUS
  Filled 2013-04-25 (×4): qty 1000

## 2013-04-25 MED ORDER — POTASSIUM CHLORIDE CRYS ER 20 MEQ PO TBCR
40.0000 meq | EXTENDED_RELEASE_TABLET | Freq: Two times a day (BID) | ORAL | Status: AC
  Administered 2013-04-25 (×2): 40 meq via ORAL
  Filled 2013-04-25 (×2): qty 2

## 2013-04-25 MED ORDER — FUROSEMIDE 40 MG PO TABS
40.0000 mg | ORAL_TABLET | Freq: Once | ORAL | Status: AC
  Administered 2013-04-26: 40 mg via ORAL
  Filled 2013-04-25: qty 1

## 2013-04-25 MED ORDER — POTASSIUM CHLORIDE 10 MEQ/100ML IV SOLN
10.0000 meq | INTRAVENOUS | Status: AC
Start: 2013-04-25 — End: 2013-04-25
  Administered 2013-04-25 (×2): 10 meq via INTRAVENOUS
  Filled 2013-04-25 (×2): qty 100

## 2013-04-25 MED ORDER — HYDROCHLOROTHIAZIDE 25 MG PO TABS
25.0000 mg | ORAL_TABLET | Freq: Every day | ORAL | Status: DC
  Administered 2013-04-26: 25 mg via ORAL
  Filled 2013-04-25: qty 1

## 2013-04-25 NOTE — Consult Note (Signed)
Reason for Consult: Hyponatremia, hypokalemia and renal failure. Referring Physician: Dr.Singh  Brittany Hickman is an 79 y.o. female.  HPI: She is patient was history of coronary artery disease status post CABG, history of hypertension he and history of her a trial fibrillation presently he was brought because of difficulty in breathing. Patient states that presently she's feeling better. Shouldn't have any nausea vomiting and no difficulty breathing. Her main complaint seems to be occasional urine incontinence. Patient denies any previous history of renal failure. She states that she has some swelling of the legs and drinks about 2-3 gallons of water every day.  patient also does not have any history of kidney stone.  Past Medical History  Diagnosis Date  . CAD (coronary artery disease)     a. s/p 5 vessel CABG in 1999.  Marland Kitchen HTN (hypertension)   . Hyperlipidemia   . Osteoarthritis   . Osteoporosis   . Mobitz II     a. 04/2013: s/p MDT Sensia DC PPM model SEDR0 1 (ser # [id number]  . Atrial fibrillation     a. Dx 04/2013-->ibutilide cardioversion-->recurrent afib within hours-->eliquis initiated.    Past Surgical History  Procedure Laterality Date  . Cholecystectomy    . Bladder suspension    . Rectal prolapse repair    . Pacemaker insertion  04-08-2013    MDT SEDR01 implanted by Dr Johney Frame for Mobitz II AV block    Family History  Problem Relation Age of Onset  . Gastric cancer Father   . Stroke Mother   . Diabetes Sister   . Epilepsy    . Breast cancer      Social History:  reports that she has never smoked. She has never used smokeless tobacco. She reports that she does not drink alcohol or use illicit drugs.  Allergies: No Known Allergies  Medications: I have reviewed the patient's current medications.  Results for orders placed during the hospital encounter of 04/23/13 (from the past 48 hour(s))  TSH     Status: None   Collection Time    04/23/13  9:25 AM      Result  Value Range   TSH 3.468  0.350 - 4.500 uIU/mL   Comment: Performed at [facility]  T4, FREE     Status: None   Collection Time    04/23/13  9:25 AM      Result Value Range   Free T4 1.69  0.80 - 1.80 ng/dL   Comment: Performed at [facility]  CBC WITH DIFFERENTIAL     Status: Abnormal   Collection Time    04/23/13  9:55 AM      Result Value Range   WBC 4.7  4.0 - 10.5 K/uL   RBC 3.30 (*) 3.87 - 5.11 MIL/uL   Hemoglobin 10.0 (*) 12.0 - 15.0 g/dL   HCT 82.9 (*) 56.2 - 13.0 %   MCV 84.8  78.0 - 100.0 fL   MCH 30.3  26.0 - 34.0 pg   MCHC 35.7  30.0 - 36.0 g/dL   RDW 86.5  78.4 - 69.6 %   Platelets 170  150 - 400 K/uL   Neutrophils Relative % 78 (*) 43 - 77 %   Neutro Abs 3.7  1.7 - 7.7 K/uL   Lymphocytes Relative 15  12 - 46 %   Lymphs Abs 0.7  0.7 - 4.0 K/uL   Monocytes Relative 7  3 - 12 %   Monocytes Absolute 0.3  0.1 -  1.0 K/uL   Eosinophils Relative 0  0 - 5 %   Eosinophils Absolute 0.0  0.0 - 0.7 K/uL   Basophils Relative 0  0 - 1 %   Basophils Absolute 0.0  0.0 - 0.1 K/uL  COMPREHENSIVE METABOLIC PANEL     Status: Abnormal   Collection Time    04/23/13  9:55 AM      Result Value Range   Sodium 120 (*) 135 - 145 mEq/L   Potassium 3.6  3.5 - 5.1 mEq/L   Chloride 87 (*) 96 - 112 mEq/L   CO2 20  19 - 32 mEq/L   Glucose, Bld 189 (*) 70 - 99 mg/dL   BUN 21  6 - 23 mg/dL   Creatinine, Ser 9.14 (*) 0.50 - 1.10 mg/dL   Calcium 8.4  8.4 - 78.2 mg/dL   Total Protein 6.1  6.0 - 8.3 g/dL   Albumin 3.2 (*) 3.5 - 5.2 g/dL   AST 21  0 - 37 U/L   ALT 16  0 - 35 U/L   Alkaline Phosphatase 55  39 - 117 U/L   Total Bilirubin 0.7  0.3 - 1.2 mg/dL   GFR calc non Af Amer 29 (*) >90 mL/min   GFR calc Af Amer 34 (*) >90 mL/min   Comment: (NOTE)     The eGFR has been calculated using the CKD EPI equation.     This calculation has not been validated in all clinical situations.     eGFR's persistently <90 mL/min signify possible Chronic Kidney     Disease.  PRO B  NATRIURETIC PEPTIDE     Status: Abnormal   Collection Time    04/23/13  9:55 AM      Result Value Range   Pro B Natriuretic peptide (BNP) 6912.0 (*) 0 - 450 pg/mL  POCT I-STAT, CHEM 8     Status: Abnormal   Collection Time    04/23/13 10:01 AM      Result Value Range   Sodium 122 (*) 135 - 145 mEq/L   Potassium 3.7  3.5 - 5.1 mEq/L   Chloride 90 (*) 96 - 112 mEq/L   BUN 19  6 - 23 mg/dL   Creatinine, Ser 9.56 (*) 0.50 - 1.10 mg/dL   Glucose, Bld 213 (*) 70 - 99 mg/dL   Calcium, Ion 0.86 (*) 1.13 - 1.30 mmol/L   TCO2 18  0 - 100 mmol/L   Hemoglobin 10.2 (*) 12.0 - 15.0 g/dL   HCT 57.8 (*) 46.9 - 62.9 %  TROPONIN I     Status: None   Collection Time    04/23/13 10:42 AM      Result Value Range   Troponin I <0.30  <0.30 ng/mL   Comment:            Due to the release kinetics of cTnI,     a negative result within the first hours     of the onset of symptoms does not rule out     myocardial infarction with certainty.     If myocardial infarction is still suspected,     repeat the test at appropriate intervals.  CBC     Status: Abnormal   Collection Time    04/23/13  3:13 PM      Result Value Range   WBC 5.5  4.0 - 10.5 K/uL   RBC 3.37 (*) 3.87 - 5.11 MIL/uL   Hemoglobin 10.2 (*) 12.0 - 15.0 g/dL  HCT 28.4 (*) 36.0 - 46.0 %   MCV 84.3  78.0 - 100.0 fL   MCH 30.3  26.0 - 34.0 pg   MCHC 35.9  30.0 - 36.0 g/dL   RDW 96.0  45.4 - 09.8 %   Platelets 154  150 - 400 K/uL  COMPREHENSIVE METABOLIC PANEL     Status: Abnormal   Collection Time    04/24/13  5:08 AM      Result Value Range   Sodium 121 (*) 135 - 145 mEq/L   Potassium 3.2 (*) 3.5 - 5.1 mEq/L   Chloride 86 (*) 96 - 112 mEq/L   CO2 20  19 - 32 mEq/L   Glucose, Bld 106 (*) 70 - 99 mg/dL   BUN 20  6 - 23 mg/dL   Creatinine, Ser 1.19 (*) 0.50 - 1.10 mg/dL   Calcium 8.3 (*) 8.4 - 10.5 mg/dL   Total Protein 6.1  6.0 - 8.3 g/dL   Albumin 3.2 (*) 3.5 - 5.2 g/dL   AST 22  0 - 37 U/L   ALT 15  0 - 35 U/L   Alkaline  Phosphatase 52  39 - 117 U/L   Total Bilirubin 0.7  0.3 - 1.2 mg/dL   GFR calc non Af Amer 29 (*) >90 mL/min   GFR calc Af Amer 34 (*) >90 mL/min   Comment: (NOTE)     The eGFR has been calculated using the CKD EPI equation.     This calculation has not been validated in all clinical situations.     eGFR's persistently <90 mL/min signify possible Chronic Kidney     Disease.  SODIUM, URINE, RANDOM     Status: None   Collection Time    04/24/13  4:30 PM      Result Value Range   Sodium, Ur 68    RENAL FUNCTION PANEL     Status: Abnormal   Collection Time    04/25/13  5:58 AM      Result Value Range   Sodium 128 (*) 135 - 145 mEq/L   Comment: DELTA CHECK NOTED   Potassium 3.2 (*) 3.5 - 5.1 mEq/L   Chloride 90 (*) 96 - 112 mEq/L   CO2 25  19 - 32 mEq/L   Glucose, Bld 99  70 - 99 mg/dL   BUN 18  6 - 23 mg/dL   Creatinine, Ser 1.47 (*) 0.50 - 1.10 mg/dL   Calcium 7.8 (*) 8.4 - 10.5 mg/dL   Phosphorus 3.6  2.3 - 4.6 mg/dL   Albumin 2.9 (*) 3.5 - 5.2 g/dL   GFR calc non Af Amer 31 (*) >90 mL/min   GFR calc Af Amer 36 (*) >90 mL/min   Comment: (NOTE)     The eGFR has been calculated using the CKD EPI equation.     This calculation has not been validated in all clinical situations.     eGFR's persistently <90 mL/min signify possible Chronic Kidney     Disease.    Dg Chest Portable 1 View  04/23/2013   CLINICAL DATA:  Shortness of breath, worsening.  EXAM: PORTABLE CHEST - 1 VIEW  COMPARISON:  04/09/2013.  FINDINGS: Trachea is midline. Heart is enlarged, stable. Pacemaker lead tips project over the right atrium and right ventricle. Thoracic aorta is calcified.  Biapical pleural parenchymal scarring. Bilateral pleural effusions with mild bibasilar airspace disease.  IMPRESSION: Enlarging bilateral pleural effusions with mild bibasilar airspace disease.   Electronically Signed  By: Leanna Battles M.D.   On: 04/23/2013 10:18    Review of Systems  Constitutional: Positive for  malaise/fatigue.  Respiratory: Positive for shortness of breath.   Cardiovascular: Positive for leg swelling. Negative for chest pain.  Gastrointestinal: Negative for nausea, vomiting and abdominal pain.  Genitourinary: Positive for urgency.  Neurological: Positive for weakness.   Blood pressure 136/60, pulse 66, temperature 98.5 F (36.9 C), temperature source Oral, resp. rate 20, height 5\' 5"  (1.651 m), weight 59.6 kg (131 lb 6.3 oz), SpO2 95.00%. Physical Exam  Constitutional: No distress.  Neck: No JVD present.  Cardiovascular:  Irigular heart rate  Respiratory: No respiratory distress. She has wheezes. She has no rales.  GI: She exhibits no distension. There is no tenderness.  Musculoskeletal: She exhibits edema.  Neurological: She is alert.    Assessment/Plan: Problem #1 hyponatremia most likely hypervolemic hyponatremia. Her sodium today has improved. Her serum and urine osmolality is presently pending. Problem #2 hypokalemia most likely secondary to diuretics Problem #3 renal failure: Has one to sure whether this is acute or chronic has patient does not have any previous history of renal failure and no other blood work except the one she has this month. This could be ischemic versus hypertensive nephrosclerosis. Of Problem #4 after fibrillation Problem #5 anemia Problem #6 history of a trial fibrillation status post cardioversion. Problem #7 history of pulmonary hypertension. Problem #8 arthritis.  Plan: We'll change her IV fluid to half-normal saline with 30 mg of KCl at 50 cc per hour.           We'll check her basic metabolic panel, phosphorus, 25 vitamin D and intact PTH.           We'll also ultrasound of her kidneys.           We'll check iron studies in the morning.             Rosselyn Martha S 04/25/2013, 7:56 AM

## 2013-04-25 NOTE — Progress Notes (Signed)
SUBJECTIVE: Brittany Hickman is feeling much better, and denies shortness of breath, chest pain, and leg swelling. She said her urologist had recommended she increase her water intake to 3 bottles daily, and she describes them as "normal water bottle size that you buy in a store".     Intake/Output Summary (Last 24 hours) at 04/25/13 1208 Last data filed at 04/25/13 0981  Gross per 24 hour  Intake 2316.67 ml  Output   2250 ml  Net  66.67 ml    Current Facility-Administered Medications  Medication Dose Route Frequency Provider Last Rate Last Dose  . apixaban (ELIQUIS) tablet 2.5 mg  2.5 mg Oral BID Rhetta Mura, MD   2.5 mg at 04/25/13 1003  . furosemide (LASIX) injection 40 mg  40 mg Intravenous Q12H Rhetta Mura, MD   40 mg at 04/25/13 1914  . labetalol (NORMODYNE) tablet 100 mg  100 mg Oral BID Laqueta Linden, MD   100 mg at 04/25/13 1003  . raloxifene (EVISTA) tablet 60 mg  60 mg Oral Daily Rhetta Mura, MD   60 mg at 04/25/13 1003  . sodium chloride 0.45 % 1,000 mL with potassium chloride 30 mEq infusion   Intravenous Continuous Jamse Mead, MD 50 mL/hr at 04/25/13 1003      Filed Vitals:   04/24/13 0513 04/24/13 1452 04/24/13 2152 04/25/13 0536  BP: 138/55 160/76 124/61 136/60  Pulse: 63 68 69 66  Temp: 97.8 F (36.6 C) 98.7 F (37.1 C) 98.3 F (36.8 C) 98.5 F (36.9 C)  TempSrc: Oral Oral Oral Oral  Resp: 18 18 20 20   Height:      Weight:      SpO2: 92% 92% 95% 95%    PHYSICAL EXAM General: NAD  Neck: No JVD, no thyromegaly or thyroid nodule.  Lungs: Slightly diminished at bases bilaterally with normal respiratory effort.  CV: Nondisplaced PMI. Regular rhythm, normal S1/S2, no S3/S4, soft I/VI holosystolic murmur along left sternal border. No pretibial edema. No carotid bruit. Normal pedal pulses.  Abdomen: Soft, nontender, no hepatosplenomegaly, no distention.  Neurologic: Alert and oriented x 3.  Psych: Normal affect.    Extremities: No clubbing or cyanosis.   TELEMETRY: Reviewed telemetry pt in paced rhythm, 60-70 bpm range.  LABS: Basic Metabolic Panel:  Recent Labs  78/29/56 0508 04/25/13 0558  NA 121* 128*  K 3.2* 3.2*  CL 86* 90*  CO2 20 25  GLUCOSE 106* 99  BUN 20 18  CREATININE 1.51* 1.42*  CALCIUM 8.3* 7.8*  PHOS  --  3.6   Liver Function Tests:  Recent Labs  04/23/13 0955 04/24/13 0508 04/25/13 0558  AST 21 22  --   ALT 16 15  --   ALKPHOS 55 52  --   BILITOT 0.7 0.7  --   PROT 6.1 6.1  --   ALBUMIN 3.2* 3.2* 2.9*   No results found for this basename: LIPASE, AMYLASE,  in the last 72 hours CBC:  Recent Labs  04/23/13 0955 04/23/13 1001 04/23/13 1513  WBC 4.7  --  5.5  NEUTROABS 3.7  --   --   HGB 10.0* 10.2* 10.2*  HCT 28.0* 30.0* 28.4*  MCV 84.8  --  84.3  PLT 170  --  154   Cardiac Enzymes:  Recent Labs  04/23/13 1042  TROPONINI <0.30   BNP: No components found with this basename: POCBNP,  D-Dimer: No results found for this basename: DDIMER,  in the  last 72 hours Hemoglobin A1C: No results found for this basename: HGBA1C,  in the last 72 hours Fasting Lipid Panel: No results found for this basename: CHOL, HDL, LDLCALC, TRIG, CHOLHDL, LDLDIRECT,  in the last 72 hours Thyroid Function Tests:  Recent Labs  04/23/13 0925  TSH 3.468   Anemia Panel: No results found for this basename: VITAMINB12, FOLATE, FERRITIN, TIBC, IRON, RETICCTPCT,  in the last 72 hours  RADIOLOGY: Dg Chest 2 View  04/09/2013   CLINICAL DATA:  Post pacemaker insertion  EXAM: CHEST  2 VIEW  COMPARISON:  Prior radiograph from 04/07/2013  FINDINGS: There has been interval placement of a left-sided dual lead transvenous pacemaker/AICD with electrodes overlying the right atrium and right ventricle. Sequelae of prior CABG again noted. Transverse heart size is stable in size and contour, and remain is within normal limits.  Lungs are mildly hypoinflated. There has been interval  development of diffuse pulmonary vascular congestion, new as compared to prior. Small bilateral pleural effusions are suspected. No pneumothorax identified.  Osseous structures are unchanged.  IMPRESSION: 1. Interval placement of dual lead left-sided transvenous pacemaker/AICD without complication. No pneumothorax. 2. Interval development of mild diffuse pulmonary vascular congestion with small bilateral pleural effusions.   Electronically Signed   By: Rise Mu M.D.   On: 04/09/2013 06:52   US Renal  04/25/2013   CLINICAL DATA:  Renal failure  EXAM: RENAL/URINARY TRACT ULTRASOUND COMPLETE  COMPARISON:  None.  FINDINGS: Right Kidney:  Length: 9.4 cm. Echogenicity within normal limits. There is a 10 x 9 x 10 mm anechoic right lower pole renal mass most consistent with a small cyst. No hydronephrosis visualized.  Left Kidney:  Length: A 0.2 cm. Echogenicity within normal limits. No mass or hydronephrosis visualized.  Bladder:  Appears normal for degree of bladder distention.  IMPRESSION: No obstructive uropathy.  Small right inferior pole renal cyst.   Electronically Signed   By: Elige Ko   On: 04/25/2013 09:49   Dg Chest Portable 1 View  04/23/2013   CLINICAL DATA:  Shortness of breath, worsening.  EXAM: PORTABLE CHEST - 1 VIEW  COMPARISON:  04/09/2013.  FINDINGS: Trachea is midline. Heart is enlarged, stable. Pacemaker lead tips project over the right atrium and right ventricle. Thoracic aorta is calcified.  Biapical pleural parenchymal scarring. Bilateral pleural effusions with mild bibasilar airspace disease.  IMPRESSION: Enlarging bilateral pleural effusions with mild bibasilar airspace disease.   Electronically Signed   By: Leanna Battles M.D.   On: 04/23/2013 10:18      ASSESSMENT AND PLAN: 1. Bilateral pleural effusions and leg edema: the leg edema appears to have resolved and she has improved symptomatically from a respiratory standpoint. Her TSH was normal. Her echo revealed  normal systolic function with no comments made with respect to diastolic function. She is currently on Lasix 40 mg IV bid. Na has now improved to 128. It is still possible she developed a decompensation from a diastolic standpoint, given her significant hypertension. Her BP is now better controlled on labetalol. She has known recalcitrant hypertension as per previous notes. She had been on HCTZ (along with ARB) for HTN, but these were stopped due to worsening renal function. Her amlodipine and hydralazine were also stopped on admission due to leg edema. Given the problems with controlling HTN in the past, along with the current diuretic requirement, I will restart HCTZ 25 mg daily starting 12/20, with continued close monitoring of renal function.  2. Symptomatic bradycardia and high-grade  AV block s/p pacemaker: telemetry reveals a paced rhythm. As such, it appears to be functioning appropriately.  3. CAD s/p CABG: she appears to be symptomatically stable from this standpoint. She denies chest pain. LV systolic function is normal as is regional wall motion.  4. Atrial fibrillation: amiodarone has been held. She remains on Eliquis. I will continue labetalol.  5. HTN: better controlled on labetalol. I would still consider restarting hydralazine if BP becomes difficult to control.  6. Hypervolemic hyponatremia: Na 128 today. I suspect this will continue to improve with continued diuresis. Fluid restriction has been emphasized.  7. Pulmonary hypertension: 53 mmHg by most recent echo. Leg edema has since resolved with diuresis.  Dispo: no further recommendations at this time. Will sign off. Please call with questions.  Prentice Docker, M.D., F.A.C.C.

## 2013-04-25 NOTE — Progress Notes (Signed)
PT Cancellation Note  Patient Details Name: Brittany Hickman MRN: [id number] DOB: 30-Sep-1920   Cancelled Treatment:    Reason Eval/Treat Not Completed: Other (comment) Pt was seen for evaluation 04-24-13 and not found to have any PT needs.  [name] 04/25/2013, 9:23 AM

## 2013-04-25 NOTE — Progress Notes (Signed)
[facility]                                                                                Patient Demographics  Brittany Hickman, is a 79 y.o. female, DOB - 10/09/1920, ZOX:[id number]  Admit date - 04/23/2013   Admitting Physician Rhetta Mura, MD  Outpatient Primary MD for the patient is Selinda Flavin, MD  LOS - 2   Chief Complaint  Patient presents with  . Shortness of Breath  . Leg Swelling       Brief narrative:   79 y.o. female known history CAD status post CABG 1999, high functioning recent admission 04/07/2013 2 :1 heart block ([place]) with intermittent complete heart block status post[employer] PPM 04/07/2013 admission, newly diagnosed atrial fibrillation, s/p cardioversion with subsequent reversion to A. Fib at that admission (on amiodarone/Elliquis) came to [facility] ed 04/23/2013 with progressive shortness of breath.  She initially exhibited significant shortness of breath and some confusion and her daughter is brought to her to the hospital    Assessment & Plan     AECHF, EF 60-65% 04/08/13- recent echo does not comment on LVH with diastolic dysfunction, she is close to compensated after IV Lasix, we'll transition her to oral Lasix, if remains stable we'll discharge her in the morning with outpatient followup with cardiology who are following the patient here.     Dilutional hyponatremia- improved with diuretics and fluid restriction continue.     Complete heart block, s/p PPM 04/2013-cardiology following.     CAD s/p CABG x 4 vessel 1999-appropriately on beta blocker 50 mg bid. Is is tapered to 12.5 twice a day until cardiology sees patient-I have held her hydralazine in addition to her Norvasc as this is therapeutic duplication and may be responsible for her lower extremity swelling      HTN (hypertension)-stable on labetalol     Hyperlipidemia-not on a statin. LDL 12/2 = 60      Osteoporosis-currently on tamoxifen and estradiol vaginal  cream. At her age, I think this is reasonable and we'll defer to PCP to make further changes.      Atrial fibrillation, Chad2Vasc ~4-currently on amiodarone 200 once daily, labetalol and Apixaban 2.5 twice a day, Colace rate control, TSH was checked 12/1 = 4.7- LFTs are normal . Cardiology following.     Mild hypothyroidism- likely sick euthyroid vs. diurnal variation on admission-please do not order further thyroid lab testing unless monitoring amiodarone toxicity . Repeat TSH, free T3 and T4 in 2 weeks time in the outpatient setting     CKD (chronic kidney disease), stage III-monitor closely, renal panel a.m. Along with LFTs      Pulmonary HTN, 53mm hg- outpt monitor       Code Status: Full  Family Communication: daughter  Disposition Plan: Home   Procedures    Echo 04-08-13    - Left ventricle: The cavity size was normal. There was mild focal basal hypertrophy of the septum. Systolic function was normal. The estimated ejection fraction was in the range of 60% to 65%. Wall motion was normal; there were no regional wall motion abnormalities. - Mitral valve: Calcified annulus. Mild regurgitation. - Left atrium: The atrium  was moderately dilated. - Right atrium: The atrium was mildly dilated. - Tricuspid valve: Moderate regurgitation. - Pulmonary arteries: Systolic pressure was mildly increased. PA peak pressure: 53mm Hg (S).     Consults  Cards   Medications  Scheduled Meds: . apixaban  2.5 mg Oral BID  . furosemide  40 mg Intravenous Q12H  . labetalol  100 mg Oral BID  . potassium chloride  10 mEq Intravenous Q1 Hr x 2  . potassium chloride  40 mEq Oral BID  . raloxifene  60 mg Oral Daily   Continuous Infusions:  PRN Meds:.  DVT Prophylaxis  Apixaban  Lab Results  Component Value Date   PLT 154 04/23/2013    Antibiotics     Anti-infectives   None          Subjective:   Brittany Hickman today has, No headache, No chest pain, No abdominal  pain - No Nausea, No new weakness tingling or numbness, No Cough - SOB   Objective:   Filed Vitals:   04/24/13 0513 04/24/13 1452 04/24/13 2152 04/25/13 0536  BP: 138/55 160/76 124/61 136/60  Pulse: 63 68 69 66  Temp: 97.8 F (36.6 C) 98.7 F (37.1 C) 98.3 F (36.8 C) 98.5 F (36.9 C)  TempSrc: Oral Oral Oral Oral  Resp: 18 18 20 20   Height:      Weight:      SpO2: 92% 92% 95% 95%    Wt Readings from Last 3 Encounters:  04/23/13 59.6 kg (131 lb 6.3 oz)  04/17/13 58.06 kg (128 lb)  04/10/13 55.9 kg (123 lb 3.8 oz)     Intake/Output Summary (Last 24 hours) at 04/25/13 1215 Last data filed at 04/25/13 1610  Gross per 24 hour  Intake 2316.67 ml  Output   2250 ml  Net  66.67 ml    Exam Awake Alert, Oriented X 3, No new F.N deficits, Normal affect [place].AT,PERRAL Supple Neck,No JVD, No cervical lymphadenopathy appriciated.  Symmetrical Chest wall movement, Good air movement bilaterally, few rales RRR,No Gallops,Rubs or new Murmurs, No Parasternal Heave +ve B.Sounds, Abd Soft, Non tender, No organomegaly appriciated, No rebound - guarding or rigidity. No Cyanosis, Clubbing or edema, No new Rash or bruise     Data Review   Micro Results No results found for this or any previous visit (from the past 240 hour(s)).  Radiology Reports Dg Chest 2 View  04/09/2013   CLINICAL DATA:  Post pacemaker insertion  EXAM: CHEST  2 VIEW  COMPARISON:  Prior radiograph from 04/07/2013  FINDINGS: There has been interval placement of a left-sided dual lead transvenous pacemaker/AICD with electrodes overlying the right atrium and right ventricle. Sequelae of prior CABG again noted. Transverse heart size is stable in size and contour, and remain is within normal limits.  Lungs are mildly hypoinflated. There has been interval development of diffuse pulmonary vascular congestion, new as compared to prior. Small bilateral pleural effusions are suspected. No pneumothorax identified.  Osseous  structures are unchanged.  IMPRESSION: 1. Interval placement of dual lead left-sided transvenous pacemaker/AICD without complication. No pneumothorax. 2. Interval development of mild diffuse pulmonary vascular congestion with small bilateral pleural effusions.   Electronically Signed   By: Rise Mu M.D.   On: 04/09/2013 06:52   US Renal  04/25/2013   CLINICAL DATA:  Renal failure  EXAM: RENAL/URINARY TRACT ULTRASOUND COMPLETE  COMPARISON:  None.  FINDINGS: Right Kidney:  Length: 9.4 cm. Echogenicity within normal limits. There is a 10 x  9 x 10 mm anechoic right lower pole renal mass most consistent with a small cyst. No hydronephrosis visualized.  Left Kidney:  Length: A 0.2 cm. Echogenicity within normal limits. No mass or hydronephrosis visualized.  Bladder:  Appears normal for degree of bladder distention.  IMPRESSION: No obstructive uropathy.  Small right inferior pole renal cyst.   Electronically Signed   By: Elige Ko   On: 04/25/2013 09:49   Dg Chest Portable 1 View  04/23/2013   CLINICAL DATA:  Shortness of breath, worsening.  EXAM: PORTABLE CHEST - 1 VIEW  COMPARISON:  04/09/2013.  FINDINGS: Trachea is midline. Heart is enlarged, stable. Pacemaker lead tips project over the right atrium and right ventricle. Thoracic aorta is calcified.  Biapical pleural parenchymal scarring. Bilateral pleural effusions with mild bibasilar airspace disease.  IMPRESSION: Enlarging bilateral pleural effusions with mild bibasilar airspace disease.   Electronically Signed   By: Leanna Battles M.D.   On: 04/23/2013 10:18    CBC  Recent Labs Lab 04/23/13 0955 04/23/13 1001 04/23/13 1513  WBC 4.7  --  5.5  HGB 10.0* 10.2* 10.2*  HCT 28.0* 30.0* 28.4*  PLT 170  --  154  MCV 84.8  --  84.3  MCH 30.3  --  30.3  MCHC 35.7  --  35.9  RDW 13.6  --  13.5  LYMPHSABS 0.7  --   --   MONOABS 0.3  --   --   EOSABS 0.0  --   --   BASOSABS 0.0  --   --     Chemistries   Recent Labs Lab  04/23/13 0955 04/23/13 1001 04/24/13 0508 04/25/13 0558  NA 120* 122* 121* 128*  K 3.6 3.7 3.2* 3.2*  CL 87* 90* 86* 90*  CO2 20  --  20 25  GLUCOSE 189* 190* 106* 99  BUN 21 19 20 18   CREATININE 1.51* 1.60* 1.51* 1.42*  CALCIUM 8.4  --  8.3* 7.8*  AST 21  --  22  --   ALT 16  --  15  --   ALKPHOS 55  --  52  --   BILITOT 0.7  --  0.7  --    ------------------------------------------------------------------------------------------------------------------ estimated creatinine clearance is 23.7 ml/min (by C-G formula based on Cr of 1.42). ------------------------------------------------------------------------------------------------------------------ No results found for this basename: HGBA1C,  in the last 72 hours ------------------------------------------------------------------------------------------------------------------ No results found for this basename: CHOL, HDL, LDLCALC, TRIG, CHOLHDL, LDLDIRECT,  in the last 72 hours ------------------------------------------------------------------------------------------------------------------  Recent Labs  04/23/13 0925  TSH 3.468   ------------------------------------------------------------------------------------------------------------------ No results found for this basename: VITAMINB12, FOLATE, FERRITIN, TIBC, IRON, RETICCTPCT,  in the last 72 hours  Coagulation profile No results found for this basename: INR, PROTIME,  in the last 168 hours  No results found for this basename: DDIMER,  in the last 72 hours  Cardiac Enzymes  Recent Labs Lab 04/23/13 1042  TROPONINI <0.30   ------------------------------------------------------------------------------------------------------------------ No components found with this basename: POCBNP,      Time Spent in minutes 35   Salina Stanfield K M.D on 04/25/2013 at 12:15 PM  Between 7am to 7pm - Pager - [phone]  After 7pm go to [email or web address] - password  [email or web address]  And look for the night coverage person covering for me after hours  [facility] Office  [phone]

## 2013-04-26 LAB — BASIC METABOLIC PANEL
BUN: 16 mg/dL (ref 6–23)
Chloride: 95 mEq/L — ABNORMAL LOW (ref 96–112)
Creatinine, Ser: 1.38 mg/dL — ABNORMAL HIGH (ref 0.50–1.10)
GFR calc Af Amer: 38 mL/min — ABNORMAL LOW (ref >90)
GFR calc non Af Amer: 33 mL/min — ABNORMAL LOW (ref >90)
Glucose, Bld: 97 mg/dL (ref 70–99)
Potassium: 4.9 mEq/L (ref 3.5–5.1)

## 2013-04-26 LAB — CBC
HCT: 25.4 % — ABNORMAL LOW (ref 36.0–46.0)
MCH: 30.4 pg (ref 26.0–34.0)
MCHC: 35.4 g/dL (ref 30.0–36.0)
MCV: 85.8 fL (ref 78.0–100.0)
Platelets: 116 10*3/uL — ABNORMAL LOW (ref 150–400)
RBC: 2.96 MIL/uL — ABNORMAL LOW (ref 3.87–5.11)
RDW: 13.7 % (ref 11.5–15.5)
WBC: 4.2 10*3/uL (ref 4.0–10.5)

## 2013-04-26 LAB — IRON AND TIBC
Iron: 36 ug/dL — ABNORMAL LOW (ref 42–135)
UIBC: 203 ug/dL (ref 125–400)

## 2013-04-26 LAB — VITAMIN D 25 HYDROXY (VIT D DEFICIENCY, FRACTURES): Vit D, 25-Hydroxy: 38 ng/mL (ref 30–89)

## 2013-04-26 MED ORDER — POTASSIUM CHLORIDE ER 10 MEQ PO TBCR: 10.0000 meq | EXTENDED_RELEASE_TABLET | Freq: Every day | ORAL | Status: DC

## 2013-04-26 MED ORDER — FUROSEMIDE 10 MG/ML IJ SOLN
20.0000 mg | Freq: Once | INTRAMUSCULAR | Status: AC
  Administered 2013-04-26: 20 mg via INTRAVENOUS
  Filled 2013-04-26: qty 2

## 2013-04-26 MED ORDER — LABETALOL HCL 100 MG PO TABS: 100.0000 mg | ORAL_TABLET | Freq: Two times a day (BID) | ORAL | Status: DC

## 2013-04-26 MED ORDER — FUROSEMIDE 40 MG PO TABS: 40.0000 mg | ORAL_TABLET | Freq: Once | ORAL | Status: DC

## 2013-04-26 MED ORDER — FUROSEMIDE 40 MG PO TABS: 40.0000 mg | ORAL_TABLET | Freq: Every day | ORAL | Status: DC

## 2013-04-26 MED ORDER — HYDRALAZINE HCL 25 MG PO TABS: 25.0000 mg | ORAL_TABLET | Freq: Three times a day (TID) | ORAL | Status: AC

## 2013-04-26 NOTE — Progress Notes (Signed)
Subjective: Interval History: has no complaint of nausea or vomiting. Patient denies also any difficulty breathing..  Objective: Vital signs in last 24 hours: Temp:  [96.2 F (35.7 C)-98.3 F (36.8 C)] 97.8 F (36.6 C) (12/20 1610) Pulse Rate:  [68-69] 68 (12/20 0613) Resp:  [18-20] 18 (12/20 0613) BP: (145-171)/(47-57) 151/47 mmHg (12/20 0613) SpO2:  [91 %-96 %] 91 % (12/20 9604) Weight change:   Intake/Output from previous day: 12/19 0701 - 12/20 0700 In: 500 [P.O.:500] Out: 1000 [Urine:1000] Intake/Output this shift: Total I/O In: -  Out: 800 [Urine:800]  General appearance: alert, cooperative and no distress Resp: clear to auscultation bilaterally Cardio: regular rate and rhythm, S1, S2 normal, no murmur, click, rub or gallop GI: soft, non-tender; bowel sounds normal; no masses,  no organomegaly Extremities: extremities normal, atraumatic, no cyanosis or edema  Lab Results:  Recent Labs  04/23/13 0955 04/23/13 1001 04/23/13 1513  WBC 4.7  --  5.5  HGB 10.0* 10.2* 10.2*  HCT 28.0* 30.0* 28.4*  PLT 170  --  154   BMET:  Recent Labs  04/24/13 0508 04/25/13 0558  NA 121* 128*  K 3.2* 3.2*  CL 86* 90*  CO2 20 25  GLUCOSE 106* 99  BUN 20 18  CREATININE 1.51* 1.42*  CALCIUM 8.3* 7.8*   No results found for this basename: PTH,  in the last 72 hours Iron Studies: No results found for this basename: IRON, TIBC, TRANSFERRIN, FERRITIN,  in the last 72 hours  Studies/Results: US Renal  04/25/2013   CLINICAL DATA:  Renal failure  EXAM: RENAL/URINARY TRACT ULTRASOUND COMPLETE  COMPARISON:  None.  FINDINGS: Right Kidney:  Length: 9.4 cm. Echogenicity within normal limits. There is a 10 x 9 x 10 mm anechoic right lower pole renal mass most consistent with a small cyst. No hydronephrosis visualized.  Left Kidney:  Length: A 0.2 cm. Echogenicity within normal limits. No mass or hydronephrosis visualized.  Bladder:  Appears normal for degree of bladder distention.   IMPRESSION: No obstructive uropathy.  Small right inferior pole renal cyst.   Electronically Signed   By: Elige Ko   On: 04/25/2013 09:49    I have reviewed the patient's current medications.  Assessment/Plan: Problem #1 renal failure: Acute on chronic. Presently patient is none oliguric. Her last creatinine is 1.42 and basic metabolic panel is pending from today. Her renal ultrasound didn't show any obstruction. Problem #2 hypertension her blood pressure seems to be reasonably controlled Problem #3 anemia her hemoglobin and hematocrit is stable. Iron studies are pending. Problem #4 hyponatremia: Sodium 128 from yesterday  has improved. Problem #5 hypokalemia patient on potassium supplement. Problem #6 metabolic bone disease her calcium and phosphorus is stable Problem #7 history of a trial fibrillation Plan: We'll change her IV fluid to half-normal saline with 20 mEq of KCl at 100 cc per hour.           We'll check her basic metabolic panel in the morning and continue his other medications as before.    LOS: 3 days   Doretta Remmert S 04/26/2013,7:44 AM

## 2013-04-26 NOTE — Progress Notes (Signed)
Pt discharged home with prescription, care notes, and instructions.  Her sister verbalized understanding.  I notified Dr. Thedore Mins of the patients request for a [facility] he stated he would send the prescription to her pharmacy.  The patient left the floor via w/c with staff and family in stable condition.  The patient was given the updated AVS with the correct Lasix order.

## 2013-04-26 NOTE — Discharge Summary (Addendum)
[facility]                                                                                   Brittany Hickman, is a 79 y.o. female  DOB 1920-12-24  MRN [id number].  Admission date:  04/23/2013  Admitting Physician  Rhetta Mura, MD  Discharge Date:  04/26/2013   Primary MD  Selinda Flavin, MD  Recommendations for primary care physician for things to follow:   Follow BMP in diuretic dose closely.   Admission Diagnosis  Hyponatremia [276.1] CHF (congestive heart failure), acute, diastolic [428.31, 428.0]  Discharge Diagnosis   acute on chronic diastolic CHF with hyponatremia  Principal Problem:   AECHF, EF 60-65% 04/08/13 Active Problems:   Complete heart block, s/p PPM  04/2013   CAD s/p CABG x 4 vessel 1999   HTN (hypertension)   Hyperlipidemia   Osteoarthritis   Osteoporosis   Mobitz II   Atrial fibrillation, Chad2Vasc ~4   CKD (chronic kidney disease), stage III   Pulmonary HTN, 53mm hg   Dilutional hyponatremia   Pulmonary edema      Past Medical History  Diagnosis Date  . CAD (coronary artery disease)     a. s/p 5 vessel CABG in 1999.  Marland Kitchen HTN (hypertension)   . Hyperlipidemia   . Osteoarthritis   . Osteoporosis   . Mobitz II     a. 04/2013: s/p MDT Sensia DC PPM model SEDR0 1 (ser # [id number]  . Atrial fibrillation     a. Dx 04/2013-->ibutilide cardioversion-->recurrent afib within hours-->eliquis initiated.    Past Surgical History  Procedure Laterality Date  . Cholecystectomy    . Bladder suspension    . Rectal prolapse repair    . Pacemaker insertion  04-08-2013    MDT [id number] implanted by Dr Johney Frame for Mobitz II AV block     Discharge Condition: stable       Follow-up Information   Follow up with Selinda Flavin, MD. Schedule an appointment as soon as possible for a visit in 3 days.   Specialty:  Family Medicine   Contact information:   [address] [place] [address] [phone]       Follow up with Prentice Docker A, MD.  Schedule an appointment as soon as possible for a visit in 1 week.   Specialty:  Cardiology   Contact information:   [address] [place] [address] [phone]         Consults obtained - Cards, nephrology   Discharge Medications      Medication List    STOP taking these medications       amiodarone 200 MG tablet  Commonly known as:  PACERONE     amLODipine 10 MG tablet  Commonly known as:  NORVASC     hydrochlorothiazide 25 MG tablet  Commonly known as:  HYDRODIURIL     metoprolol 50 MG tablet  Commonly known as:  LOPRESSOR      TAKE these medications       apixaban 2.5 MG Tabs tablet  Commonly known as:  ELIQUIS  Take 1 tablet (2.5 mg total) by mouth 2 (two) times  daily.     CALCIUM PO  Take 1 tablet by mouth daily.     CENTRUM SILVER ADULT 50+ Tabs  Take by mouth daily.     estradiol 0.1 MG/GM vaginal cream  Commonly known as:  ESTRACE  Place 1 Applicatorful vaginally once a week. On Saturdays     furosemide 40 MG tablet  Commonly known as:  LASIX  Take 1 tablet (40 mg total) by mouth daily.     hydrALAZINE 25 MG tablet  Commonly known as:  APRESOLINE  Take 1 tablet (25 mg total) by mouth every 8 (eight) hours.     labetalol 100 MG tablet  Commonly known as:  NORMODYNE  Take 1 tablet (100 mg total) by mouth 2 (two) times daily.     nitroGLYCERIN 0.4 MG SL tablet  Commonly known as:  NITROSTAT  Place 1 tablet (0.4 mg total) under the tongue every 5 (five) minutes x 3 doses as needed for chest pain.     potassium chloride 10 MEQ tablet  Commonly known as:  K-DUR  Take 1 tablet (10 mEq total) by mouth daily.  Start taking on:  04/28/2013     raloxifene 60 MG tablet  Commonly known as:  EVISTA  Take 60 mg by mouth daily.         Diet and Activity recommendation: See Discharge Instructions below   Discharge Instructions     Follow with Primary MD Selinda Flavin, MD in 3 days   Get CBC, CMP, TSH checked 3 days by Primary MD  and again as instructed by your Primary MD.    Get Medicines reviewed and adjusted.  Please request your Prim.MD to go over all Hospital Tests and Procedure/Radiological results at the follow up, please get all Hospital records sent to your Prim MD by signing hospital release before you go home.  Activity: As tolerated with Full fall precautions use walker/cane & assistance as needed   Diet:  Heart Healthy,  Fluid restriction 1.8 lit/day, Aspiration precautions.  Check your Weight same time everyday, if you gain over 2 pounds, or you develop in leg swelling, experience more shortness of breath or chest pain, call your Primary MD immediately. Follow Cardiac Low Salt Diet and 1.8 lit/day fluid restriction.  Disposition Home   If you experience worsening of your admission symptoms, develop shortness of breath, life threatening emergency, suicidal or homicidal thoughts you must seek medical attention immediately by calling 911 or calling your MD immediately  if symptoms less severe.  You Must read complete instructions/literature along with all the possible adverse reactions/side effects for all the Medicines you take and that have been prescribed to you. Take any new Medicines after you have completely understood and accpet all the possible adverse reactions/side effects.   Do not drive and provide baby sitting services if your were admitted for syncope or siezures until you have seen by Primary MD or a Neurologist and advised to do so again.  Do not drive when taking Pain medications.    Do not take more than prescribed Pain, Sleep and Anxiety Medications  Special Instructions: If you have smoked or chewed Tobacco  in the last 2 yrs please stop smoking, stop any regular Alcohol  and or any Recreational drug use.  Wear Seat belts while driving.   Please note  You were cared for by a hospitalist during your hospital stay. If you have any questions about your discharge medications or the  care you received while you  were in the hospital after you are discharged, you can call the unit and asked to speak with the hospitalist on call if the hospitalist that took care of you is not available. Once you are discharged, your primary care physician will handle any further medical issues. Please note that NO REFILLS for any discharge medications will be authorized once you are discharged, as it is imperative that you return to your primary care physician (or establish a relationship with a primary care physician if you do not have one) for your aftercare needs so that they can reassess your need for medications and monitor your lab values.    Major procedures and Radiology Reports - PLEASE review detailed and final reports for all details, in brief -       Dg Chest 2 View  04/09/2013   CLINICAL DATA:  Post pacemaker insertion  EXAM: CHEST  2 VIEW  COMPARISON:  Prior radiograph from 04/07/2013  FINDINGS: There has been interval placement of a left-sided dual lead transvenous pacemaker/AICD with electrodes overlying the right atrium and right ventricle. Sequelae of prior CABG again noted. Transverse heart size is stable in size and contour, and remain is within normal limits.  Lungs are mildly hypoinflated. There has been interval development of diffuse pulmonary vascular congestion, new as compared to prior. Small bilateral pleural effusions are suspected. No pneumothorax identified.  Osseous structures are unchanged.  IMPRESSION: 1. Interval placement of dual lead left-sided transvenous pacemaker/AICD without complication. No pneumothorax. 2. Interval development of mild diffuse pulmonary vascular congestion with small bilateral pleural effusions.   Electronically Signed   By: Rise Mu M.D.   On: 04/09/2013 06:52   US Renal  04/25/2013   CLINICAL DATA:  Renal failure  EXAM: RENAL/URINARY TRACT ULTRASOUND COMPLETE  COMPARISON:  None.  FINDINGS: Right Kidney:  Length: 9.4 cm.  Echogenicity within normal limits. There is a 10 x 9 x 10 mm anechoic right lower pole renal mass most consistent with a small cyst. No hydronephrosis visualized.  Left Kidney:  Length: A 0.2 cm. Echogenicity within normal limits. No mass or hydronephrosis visualized.  Bladder:  Appears normal for degree of bladder distention.  IMPRESSION: No obstructive uropathy.  Small right inferior pole renal cyst.   Electronically Signed   By: Elige Ko   On: 04/25/2013 09:49   Dg Chest Portable 1 View  04/23/2013   CLINICAL DATA:  Shortness of breath, worsening.  EXAM: PORTABLE CHEST - 1 VIEW  COMPARISON:  04/09/2013.  FINDINGS: Trachea is midline. Heart is enlarged, stable. Pacemaker lead tips project over the right atrium and right ventricle. Thoracic aorta is calcified.  Biapical pleural parenchymal scarring. Bilateral pleural effusions with mild bibasilar airspace disease.  IMPRESSION: Enlarging bilateral pleural effusions with mild bibasilar airspace disease.   Electronically Signed   By: Leanna Battles M.D.   On: 04/23/2013 10:18    Micro Results      No results found for this or any previous visit (from the past 240 hour(s)).   History of present illness and  Hospital Course:     Kindly see H&P for history of present illness and admission details, please review complete Labs, Consult reports and Test reports for all details in brief Brittany Hickman, is a 79 y.o. female, patient with history of   chronic diastolic CHF EF 60%, hypertension, CAD, chronic kidney disease stage III, but her status post pacemaker placement, dyslipidemia, atrial fibrillation on amiodarone along with Eliquis who was admitted to the  hospital with acute on chronic diastolic heart failure causing shortness of breath, fluid overload and hyponatremia.    She was seen here by nephrology and cardiology, she was placed on IV diuretics with good results, her sodium has improved with fluid restriction and diuresis and she is  clinically compensated from a heart failure standpoint was no peripheral edema or rales. She's currently not short of breath, not requiring any oxygen and eager to go home. Her medications have been adjusted as per the guidance by cardiology. She will be placed on Lasix along with low-dose potassium supplementation which will start on 04/29/2013, will request primary care physician to monitor her weight, BMP and adjust diuretic dose and antihypertensives as needed.   From a complete heart block standpoint she status post pacemaker placement she was stable. She was also stable from CAD standpoint with no chest pain or positive troponins.    She will resume her home dose statin for her underlying dyslipidemia, for her atrial fibrillation she will continue amiodarone, now placed on labetalol instead of Lopressor by cardiologist along with home dose Eliquis.   Her TSH was slightly elevated this admission likely sick euthyroid syndrome. We'll request PCP to repeat TSH, free T3 and T4 in about 2 weeks' time.     Today   Subjective:   Brittany Hickman today has no headache,no chest abdominal pain,no new weakness tingling or numbness, feels much better wants to go home today.    Objective:   Blood pressure 151/47, pulse 68, temperature 97.8 F (36.6 C), temperature source Oral, resp. rate 18, height 5\' 5"  (1.651 m), weight 59.6 kg (131 lb 6.3 oz), SpO2 91.00%.   Intake/Output Summary (Last 24 hours) at 04/26/13 1241 Last data filed at 04/26/13 0848  Gross per 24 hour  Intake    360 ml  Output   1400 ml  Net  -1040 ml    Exam Awake Alert, Oriented *3, No new F.N deficits, Normal affect [place].AT,PERRAL Supple Neck,No JVD, No cervical lymphadenopathy appriciated.  Symmetrical Chest wall movement, Good air movement bilaterally, CTAB RRR,No Gallops,Rubs or new Murmurs, No Parasternal Heave +ve B.Sounds, Abd Soft, Non tender, No organomegaly appriciated, No rebound -guarding or rigidity. No  Cyanosis, Clubbing or edema, No new Rash or bruise  Data Review   CBC w Diff: Lab Results  Component Value Date   WBC 4.2 04/26/2013   HGB 9.0* 04/26/2013   HCT 25.4* 04/26/2013   PLT 116* 04/26/2013   LYMPHOPCT 15 04/23/2013   MONOPCT 7 04/23/2013   EOSPCT 0 04/23/2013   BASOPCT 0 04/23/2013    CMP: Lab Results  Component Value Date   NA 128* 04/26/2013   K 4.9 04/26/2013   CL 95* 04/26/2013   CO2 26 04/26/2013   BUN 16 04/26/2013   CREATININE 1.38* 04/26/2013   PROT 6.1 04/24/2013   ALBUMIN 2.9* 04/25/2013   BILITOT 0.7 04/24/2013   ALKPHOS 52 04/24/2013   AST 22 04/24/2013   ALT 15 04/24/2013  .   Total Time in preparing paper work, data evaluation and todays exam - 35 minutes  Leroy Sea M.D on 04/26/2013 at 12:41 PM  [facility] Office  [phone]

## 2013-04-28 LAB — PTH, INTACT AND CALCIUM: PTH: 151.4 pg/mL — ABNORMAL HIGH (ref 14.0–72.0)

## 2013-05-05 ENCOUNTER — Encounter: Payer: Self-pay | Admitting: Internal Medicine

## 2013-05-09 ENCOUNTER — Ambulatory Visit: Payer: Medicare Other | Admitting: Physician Assistant

## 2013-05-13 ENCOUNTER — Ambulatory Visit (INDEPENDENT_AMBULATORY_CARE_PROVIDER_SITE_OTHER): Payer: Medicare Other | Admitting: Physician Assistant

## 2013-05-13 ENCOUNTER — Encounter: Payer: Self-pay | Admitting: Physician Assistant

## 2013-05-13 ENCOUNTER — Other Ambulatory Visit: Payer: Medicare Other

## 2013-05-13 VITALS — BP 94/50 | HR 67 | Ht 65.0 in | Wt 107.0 lb

## 2013-05-13 DIAGNOSIS — I10 Essential (primary) hypertension: Secondary | ICD-10-CM

## 2013-05-13 DIAGNOSIS — N183 Chronic kidney disease, stage 3 unspecified: Secondary | ICD-10-CM

## 2013-05-13 DIAGNOSIS — I5030 Unspecified diastolic (congestive) heart failure: Secondary | ICD-10-CM

## 2013-05-13 DIAGNOSIS — I442 Atrioventricular block, complete: Secondary | ICD-10-CM

## 2013-05-13 DIAGNOSIS — I4891 Unspecified atrial fibrillation: Secondary | ICD-10-CM

## 2013-05-13 DIAGNOSIS — E785 Hyperlipidemia, unspecified: Secondary | ICD-10-CM

## 2013-05-13 DIAGNOSIS — E236 Other disorders of pituitary gland: Secondary | ICD-10-CM

## 2013-05-13 DIAGNOSIS — I2510 Atherosclerotic heart disease of native coronary artery without angina pectoris: Secondary | ICD-10-CM

## 2013-05-13 DIAGNOSIS — I509 Heart failure, unspecified: Secondary | ICD-10-CM

## 2013-05-13 DIAGNOSIS — E871 Hypo-osmolality and hyponatremia: Secondary | ICD-10-CM

## 2013-05-13 LAB — BASIC METABOLIC PANEL
BUN: 33 mg/dL — ABNORMAL HIGH (ref 6–23)
CALCIUM: 9.4 mg/dL (ref 8.4–10.5)
CO2: 30 mEq/L (ref 19–32)
Chloride: 96 mEq/L (ref 96–112)
Creatinine, Ser: 2.2 mg/dL — ABNORMAL HIGH (ref 0.4–1.2)
GFR: 21.8 mL/min — ABNORMAL LOW (ref >60.00)
Glucose, Bld: 146 mg/dL — ABNORMAL HIGH (ref 70–99)
Potassium: 4.2 mEq/L (ref 3.5–5.1)
Sodium: 136 mEq/L (ref 135–145)

## 2013-05-13 NOTE — Progress Notes (Signed)
[address] [place], [place]  [address] Phone: [phone] Fax:  [phone]  Date:  05/13/2013   ID:  Brittany Hickman, DOB Apr 29, 1921, MRN [id number]  PCP:  Selinda FlavinHOWARD, KEVIN, MD  Cardiologist:  Dr. Rollene RotundaJames Hochrein   Electrophysiologist:  Dr. Lewayne BuntingGregg Taylor    History of Present Illness: Brittany Hickman is a 80 y.o. female with a hx of CAD, status post CABG in 1999, HTN, CKD stage III, HL. She was admitted 04/07/13-04/10/13 after presenting with symptomatic high grade heart block.  Echo (04/08/2013): Mild focal basal hypertrophy of the septum, EF 60-65%, mild MR, mod LAE, mild RAE, mod TR, PASP 53.  She underwent placement of a Medtronic dual chamber pacer by Dr. Hillis RangeJames Allred.  The patient developed AFib after implantation of her device. She underwent chemical cardioversion with ibutilide. She initially was converted to NSR but then reverted back to atrial fibrillation. Heart rate remained controlled. She was placed on amiodarone.  CHADS2-VASc=5.  She was also placed on Eliquis 2.5 bid.  Plan was to pursue outpatient DCCV after 3 weeks of uninterrupted anticoagulation.  I saw her in follow up 04/17/13. She had converted back to NSR. Hydralazine was adjusted for uncontrolled hypertension.  She was then admitted again 04/23/13-04/26/13 with acute HFpEF in the setting of profound hypervolemic hyponatremia (Na 120).  Amiodarone was held due to concerns of underlying hypothyroidism made worse leading to low Na.  Follow up TSH was normal.  She was seen by nephrology.  She had worsening creatinine but this was stable at discharge.  Sodium improved with diuresis.  Hydralazine and amlodipine were stopped due to LE edema.    She has continued to lose weight since d/c (121=>105).  She is weak and has no appetite.  She denies chest pain, dyspnea, orthopnea, PND, edema.  No syncope.  She is dizzy sometimes.   Recent Labs: 04/08/2013: HDL 22*; LDL (calc) 60  04/23/2013: Pro B Natriuretic peptide (BNP)  6912.0*; TSH 3.468  04/24/2013: ALT 15  04/26/2013: Creatinine 1.38*; Hemoglobin 9.0*; Potassium 4.9   Wt Readings from Last 3 Encounters:  05/13/13 107 lb (48.535 kg)  04/23/13 131 lb 6.3 oz (59.6 kg)  04/17/13 128 lb (58.06 kg)     Past Medical History  Diagnosis Date  . CAD (coronary artery disease)     a. s/p 5 vessel CABG in 1999.  Marland Kitchen. HTN (hypertension)   . Hyperlipidemia   . Osteoarthritis   . Osteoporosis   . Mobitz II     a. 04/2013: s/p MDT Sensia DC PPM model SEDR0 1 (ser # [id number] H  . Atrial fibrillation     a. Dx 04/2013-->ibutilide cardioversion-->recurrent afib within hours-->eliquis initiated.    Current Outpatient Prescriptions  Medication Sig Dispense Refill  . apixaban (ELIQUIS) 2.5 MG TABS tablet Take 1 tablet (2.5 mg total) by mouth 2 (two) times daily.  60 tablet  6  . CALCIUM PO Take 1 tablet by mouth daily.      Marland Kitchen. estradiol (ESTRACE) 0.1 MG/GM vaginal cream Place 1 Applicatorful vaginally once a week. On Saturdays      . furosemide (LASIX) 40 MG tablet Take 1 tablet (40 mg total) by mouth daily.  30 tablet  0  . hydrALAZINE (APRESOLINE) 25 MG tablet Take 1 tablet (25 mg total) by mouth every 8 (eight) hours.  90 tablet  6  . labetalol (NORMODYNE) 100 MG tablet Take 1 tablet (100 mg total) by mouth 2 (two) times daily.  60  tablet  0  . Multiple Vitamins-Minerals (CENTRUM SILVER ADULT 50+) TABS Take by mouth daily.      . nitroGLYCERIN (NITROSTAT) 0.4 MG SL tablet Place 1 tablet (0.4 mg total) under the tongue every 5 (five) minutes x 3 doses as needed for chest pain.  25 tablet  3  . potassium chloride (K-DUR) 10 MEQ tablet Take 1 tablet (10 mEq total) by mouth daily.  30 tablet  0  . raloxifene (EVISTA) 60 MG tablet Take 60 mg by mouth daily.       No current facility-administered medications for this visit.    Allergies:   Review of patient's allergies indicates no known allergies.   Social History:  The patient  reports that she has never smoked.  She has never used smokeless tobacco. She reports that she does not drink alcohol or use illicit drugs.   Family History:  The patient's family history includes Breast cancer in an other family member; Diabetes in her sister; Epilepsy in an other family member; Gastric cancer in her father; Stroke in her mother.   ROS:  Please see the history of present illness.      All other systems reviewed and negative.   PHYSICAL EXAM: VS:  BP 94/50  Pulse 67  Ht 5\' 5"  (1.651 m)  Wt 107 lb (48.535 kg)  BMI 17.81 kg/m2 Well nourished, well developed, in no acute distress HEENT: normal Neck: no JVD at 90 Cardiac:  normal S1, S2; RRR; no murmur Lungs:  clear to auscultation bilaterally, no wheezing, rhonchi or rales Abd: soft, nontender, no hepatomegaly Ext: no edema Skin: warm and dry Neuro:  CNs 2-12 intact, no focal abnormalities noted  EKG:  AV paced, HR 67     ASSESSMENT AND PLAN:  1. Heart Failure with Preserved Ejection Fraction (HFpEF):  Overall, her volume appears stable. She may be getting somewhat dry as she continues to lose weight and is weak. I will reduce her Lasix to 20 mg daily. I will cut her potassium back to 5 mEq daily. Check a basic metabolic panel today. If her creatinine has increased, consider stopping her Lasix. 2. Hyponatremia:  Repeat basic metabolic panel today. 3. Atrial Fibrillation:  Maintaining NSR. She remains on Eliquis for anticoagulation. She is now off of amiodarone. 4. Mobitz type II, Status Post Pacemaker:  F/u with EP as planned.   5. CAD:  No angina. She is not on aspirin as she is now on Apixaban.  6. Hypertension:  controlled 7. Chronic Kidney Disease:  check follow up basic metabolic panel today. 8. Disposition:  She prefers to follow up in [place] as this is closer to her home.  Follow up in our [place] clinic in 1 month.  F/u with PCP for poor appetite.   Signed, Tereso Newcomer, PA-C  05/13/2013 11:59 AM

## 2013-05-13 NOTE — Patient Instructions (Addendum)
Will obtain labs today and call you with the results (BMET)  DECREASE LASIX (FUROSEMIDE) TO 20 MG DAILY (1/2 TABLET DAILY)  DECREASE POTASSIUM TO 5 MEQ DAILY (1/2 TABLET DAILY)  Call back if you continue to feel weak/weight continues to decrease Call your Primary Care Physician no chance in your appetite  Your physician recommends that you schedule a follow-up appointment in: 1 month in the [place] ([place])

## 2013-05-14 ENCOUNTER — Telehealth: Payer: Self-pay | Admitting: *Deleted

## 2013-05-14 MED ORDER — POTASSIUM CHLORIDE ER 10 MEQ PO TBCR: EXTENDED_RELEASE_TABLET | ORAL | Status: DC

## 2013-05-14 MED ORDER — FUROSEMIDE 40 MG PO TABS: ORAL_TABLET | ORAL | Status: DC

## 2013-05-14 NOTE — Telephone Encounter (Signed)
pt's daughter Burna MortimerWanda cb and has also been notified about lab results and med changes. [facility] w/results faxed to [place] [phone]; [facility] at [phone] ; also daughter Burna MortimerWanda asked about a bedside commode for pt due to pt is weak and has a hard time walking to the bathroom. Order for commode was faxed to [facility] [phone]

## 2013-05-14 NOTE — Telephone Encounter (Signed)
Follow Up  ° °Returned call  °

## 2013-05-14 NOTE — Telephone Encounter (Signed)
lmptcb on # [phone] pt's daughter's phone to go over lab results and med changes; pt's home # just keeps ringing busy

## 2013-05-14 NOTE — Telephone Encounter (Signed)
pt's daughter Silvio PateShelia cb about lab results and med changes. She asked for me to s/w her sister Brittany RakersWanda King as to where to have STAT bmet Friday AM 05/16/13. I lmptcb on [employer] Air[employer]anda King's phone for cb.

## 2013-05-15 ENCOUNTER — Telehealth: Payer: Self-pay | Admitting: Physician Assistant

## 2013-05-15 NOTE — Telephone Encounter (Signed)
New messge     Checking status on order or potty chair.  Pt has not heard anything

## 2013-05-16 ENCOUNTER — Telehealth: Payer: Self-pay | Admitting: *Deleted

## 2013-05-16 ENCOUNTER — Other Ambulatory Visit: Payer: Self-pay | Admitting: Physician Assistant

## 2013-05-16 DIAGNOSIS — N183 Chronic kidney disease, stage 3 unspecified: Secondary | ICD-10-CM

## 2013-05-16 DIAGNOSIS — I4891 Unspecified atrial fibrillation: Secondary | ICD-10-CM

## 2013-05-16 DIAGNOSIS — I10 Essential (primary) hypertension: Secondary | ICD-10-CM

## 2013-05-16 LAB — BASIC METABOLIC PANEL
BUN: 36 mg/dL — ABNORMAL HIGH (ref 6–23)
CO2: 29 mEq/L (ref 19–32)
Calcium: 9.3 mg/dL (ref 8.4–10.5)
Chloride: 93 mEq/L — ABNORMAL LOW (ref 96–112)
Creat: 2.05 mg/dL — ABNORMAL HIGH (ref 0.50–1.10)
Glucose, Bld: 207 mg/dL — ABNORMAL HIGH (ref 70–99)
POTASSIUM: 4.5 meq/L (ref 3.5–5.3)
SODIUM: 137 meq/L (ref 135–145)

## 2013-05-16 NOTE — Telephone Encounter (Signed)
s/w Shantelle @ [facility] about info needed for the portable potty chair for the pt. 

## 2013-05-16 NOTE — Telephone Encounter (Signed)
s/w Shantelle @ [facility] about info needed for the portable potty chair for the pt.

## 2013-05-16 NOTE — Telephone Encounter (Signed)
New Problem:  [name] from [facility] states she needs the patients height and wt. Along with  insurance information... States she needs this information in reference to an order that was faxed to her. [name]'s fax is below..  [phone]

## 2013-05-16 NOTE — Telephone Encounter (Signed)
s/w pt's daughter Regan RakersWanda King about lab results, pt is to remain off lasix and get repeat bmet 1/14 @ [facility] [place]. Burna MortimerWanda verbalized understanding to Plan of Care

## 2013-05-21 ENCOUNTER — Other Ambulatory Visit: Payer: Self-pay | Admitting: Physician Assistant

## 2013-05-21 LAB — BASIC METABOLIC PANEL
BUN: 28 mg/dL — ABNORMAL HIGH (ref 6–23)
CO2: 30 meq/L (ref 19–32)
Calcium: 9.1 mg/dL (ref 8.4–10.5)
Chloride: 99 mEq/L (ref 96–112)
Creat: 1.82 mg/dL — ABNORMAL HIGH (ref 0.50–1.10)
GLUCOSE: 141 mg/dL — AB (ref 70–99)
POTASSIUM: 4.8 meq/L (ref 3.5–5.3)
SODIUM: 137 meq/L (ref 135–145)

## 2013-05-27 ENCOUNTER — Telehealth: Payer: Self-pay | Admitting: *Deleted

## 2013-05-27 NOTE — Telephone Encounter (Signed)
lmptcb for lab results 

## 2013-05-28 NOTE — Telephone Encounter (Signed)
lmptcb on daughter Brittany Hickman's cell # for lab results and to schedule for repeat bmet to be done in 2 weeks

## 2013-05-29 NOTE — Telephone Encounter (Signed)
pt's daughter Burna MortimerWanda notified about lab results with [facility] [place], I will fax an order today for bmet. Advised ok to take lasix 20 x 1 an call us if >3 wt x 1 day or >5 x 1 wk.

## 2013-06-02 ENCOUNTER — Other Ambulatory Visit: Payer: Self-pay | Admitting: *Deleted

## 2013-06-02 MED ORDER — LABETALOL HCL 100 MG PO TABS: 100.0000 mg | ORAL_TABLET | Freq: Two times a day (BID) | ORAL | Status: DC

## 2013-06-18 ENCOUNTER — Ambulatory Visit (INDEPENDENT_AMBULATORY_CARE_PROVIDER_SITE_OTHER): Payer: Medicare Other | Admitting: Physician Assistant

## 2013-06-18 ENCOUNTER — Ambulatory Visit: Payer: Medicare Other | Admitting: Cardiology

## 2013-06-18 ENCOUNTER — Encounter: Payer: Self-pay | Admitting: Physician Assistant

## 2013-06-18 VITALS — BP 150/56 | HR 90 | Ht 65.0 in | Wt 109.0 lb

## 2013-06-18 DIAGNOSIS — I509 Heart failure, unspecified: Secondary | ICD-10-CM

## 2013-06-18 DIAGNOSIS — I10 Essential (primary) hypertension: Secondary | ICD-10-CM

## 2013-06-18 DIAGNOSIS — N183 Chronic kidney disease, stage 3 unspecified: Secondary | ICD-10-CM

## 2013-06-18 DIAGNOSIS — I4891 Unspecified atrial fibrillation: Secondary | ICD-10-CM

## 2013-06-18 DIAGNOSIS — I442 Atrioventricular block, complete: Secondary | ICD-10-CM

## 2013-06-18 LAB — BASIC METABOLIC PANEL
BUN: 17 mg/dL (ref 6–23)
CO2: 28 meq/L (ref 19–32)
CREATININE: 1.36 mg/dL — AB (ref 0.50–1.10)
Calcium: 8.8 mg/dL (ref 8.4–10.5)
Chloride: 103 mEq/L (ref 96–112)
GLUCOSE: 104 mg/dL — AB (ref 70–99)
Potassium: 4.3 mEq/L (ref 3.5–5.3)
Sodium: 138 mEq/L (ref 135–145)

## 2013-06-18 NOTE — Assessment & Plan Note (Signed)
Check BMET 

## 2013-06-18 NOTE — Assessment & Plan Note (Signed)
Recent pacemaker in 04/2013. Followup with Dr. Ladona Ridgelaylor next month.

## 2013-06-18 NOTE — Progress Notes (Signed)
HPI:  This is a 80 year old female patient who is here for one-month followup. She last saw Tereso Newcomer at which time he initially decreased her Lasix because she was becoming dry. Crt 1.82 on 05/21/13, but it was then stopped and she takes it prn.  She has a hx of CAD, status post CABG in 1999, HTN, CKD stage III, HL. She was admitted 04/07/13-04/10/13 after presenting with symptomatic high grade heart block.  Echo (04/08/2013): Mild focal basal hypertrophy of the septum, EF 60-65%, mild MR, mod LAE, mild RAE, mod TR, PASP 53.  She underwent placement of a Medtronic dual chamber pacer by Dr. Hillis Range.  The patient developed AFib after implantation of her device. She underwent chemical cardioversion with ibutilide. She initially was converted to NSR but then reverted back to atrial fibrillation. Heart rate remained controlled. She was placed on amiodarone.  CHADS2-VASc=5.  She was also placed on Eliquis 2.5 bid.  Plan was to pursue outpatient DCCV after 3 weeks of uninterrupted anticoagulation.  Tereso Newcomer saw her in follow up 04/17/13. She had converted back to NSR. Hydralazine was adjusted for uncontrolled hypertension.  She was then admitted again 04/23/13-04/26/13 with acute HFpEF in the setting of profound hypervolemic hyponatremia (Na 120).  Amiodarone was held due to concerns of underlying hypothyroidism made worse leading to low Na.  Follow up TSH was normal.  She was seen by nephrology.  She had worsening creatinine but this was stable at discharge.  Sodium improved with diuresis.  Hydralazine and amlodipine were stopped due to LE edema.    She is feeling much better since she last saw Tereso Newcomer. Her appetite has gradually come back and she has more energy. She is walking with a walker and actually getting herself up in the a.m. before her daughter checks on her. She did have mild swelling Friday and Saturday morning and her daughter gave her Lasix 40 mg both of those days. The swelling  resolved. She didn't have any shortness of breath or weight gain associated with it. The daughter says she was eating a few chips and may have gotten excess salt in her diet.   No Known Allergies  Current Outpatient Prescriptions on File Prior to Visit: apixaban (ELIQUIS) 2.5 MG TABS tablet, Take 1 tablet (2.5 mg total) by mouth 2 (two) times daily., Disp: 60 tablet, Rfl: 6 CALCIUM PO, Take 1 tablet by mouth daily., Disp: , Rfl:  estradiol (ESTRACE) 0.1 MG/GM vaginal cream, Place 1 Applicatorful vaginally once a week. On Saturdays, Disp: , Rfl:  hydrALAZINE (APRESOLINE) 25 MG tablet, Take 1 tablet (25 mg total) by mouth every 8 (eight) hours., Disp: 90 tablet, Rfl: 6 labetalol (NORMODYNE) 100 MG tablet, Take 1 tablet (100 mg total) by mouth 2 (two) times daily., Disp: 60 tablet, Rfl: 0 Multiple Vitamins-Minerals (CENTRUM SILVER ADULT 50+) TABS, Take by mouth daily., Disp: , Rfl:  nitroGLYCERIN (NITROSTAT) 0.4 MG SL tablet, Place 1 tablet (0.4 mg total) under the tongue every 5 (five) minutes x 3 doses as needed for chest pain., Disp: 25 tablet, Rfl: 3 raloxifene (EVISTA) 60 MG tablet, Take 60 mg by mouth daily., Disp: , Rfl:   No current facility-administered medications on file prior to visit.   Past Medical History:   CAD (coronary artery disease)                                  Comment:a. s/p 5 vessel  CABG in 1999.   HTN (hypertension)                                           Hyperlipidemia                                               Osteoarthritis                                               Osteoporosis                                                 Mobitz II                                                      Comment:a. 04/2013: s/p MDT Jana Half DC PPM model SEDR0 1              (ser # [id number]   Atrial fibrillation                                            Comment:a. Dx 04/2013-->ibutilide               cardioversion-->recurrent afib within               hours-->eliquis  initiated.  Past Surgical History:   CHOLECYSTECTOMY                                               BLADDER SUSPENSION                                            RECTAL PROLAPSE REPAIR                                        PACEMAKER INSERTION                              04-08-2013      Comment:MDT SEDR01 implanted by Dr Johney Frame for Mobitz II              AV block  Review of patient's family history indicates:   Gastric cancer                 Father  Stroke                         Mother                   Diabetes                       Sister                   Epilepsy                                                Breast cancer                                           Social History   Marital Status: Married             Spouse Name:                      Years of Education:                 Number of children:             Occupational History   None on file  Social History Main Topics   Smoking Status: Never Smoker                     Smokeless Status: Never Used                       Alcohol Use: No             Drug Use: No             Sexual Activity: No                 Other Topics            Concern   None on file  Social History Narrative   Patient lives in [place] by herself.  She has daughters nearby that are very involved and do her grocery shopping and bring her to church.    ROS: Hard of hearing, walks with walker, see history of present illness otherwise negative   PHYSICAL EXAM: Well-nournished, in no acute distress. Neck: No JVD, HJR, Bruit, or thyroid enlargement  Lungs: Decreased breath sounds but No tachypnea, clear without wheezing, rales, or rhonchi  Cardiovascular: RRR, PMI not displaced, 2-3/6 systolic murmur at apex, no gallops, bruit, thrill, or heave.  Abdomen: BS normal. Soft without organomegaly, masses, lesions or tenderness.  Extremities: without cyanosis, clubbing or edema. Good distal pulses bilateral  SKin: Warm, no  lesions or rashes   Musculoskeletal: No deformities  Neuro: no focal signs  BP 183/88  Pulse 90  Ht 5\' 5"  (1.651 m)  Wt 109 lb (49.442 kg)  BMI 18.14 kg/m2 repeat blood pressure 150/56   EKG: Normal sinus rhythm, paced rhythm  BMET 05/21/13:Results for Forde DandyORE, Loreda T (MRN [id number]) as of 06/18/2013 11:24  Ref. Range 05/21/2013 10:24  Sodium Latest Range: 135-145 mEq/L 137  Potassium Latest Range: 3.5-5.3 mEq/L 4.8  Chloride Latest Range: 96-112 mEq/L 99  CO2 Latest Range: 19-32  mEq/L 30  BUN Latest Range: 6-23 mg/dL 28 (H)  Creatinine Latest Range: 0.4-1.2 mg/dL 1.61 (H)  Calcium Latest Range: 8.4-10.5 mg/dL 9.1

## 2013-06-18 NOTE — Assessment & Plan Note (Signed)
Patient maintaining normal sinus rhythm. She is on Eliquis.

## 2013-06-18 NOTE — Patient Instructions (Signed)
Your physician recommends that you schedule a follow-up appointment in: KEEP APPOINTMENT WITH Dr. Lewayne BuntingGregg Taylor 07-14-13 AT 10:30AM  Your physician recommends that you return for lab work in: TODAY (SLIPS GIVEN FOR BMET)  Your physician recommends YOU START A LOW SODIUM DIET AS FOLLOWS:  2 Gram Low Sodium Diet A 2 gram sodium diet restricts the amount of sodium in the diet to no more than 2 g or 2000 mg daily. Limiting the amount of sodium is often used to help lower blood pressure. It is important if you have heart, liver, or kidney problems. Many foods contain sodium for flavor and sometimes as a preservative. When the amount of sodium in a diet needs to be low, it is important to know what to look for when choosing foods and drinks. The following includes some information and guidelines to help make it easier for you to adapt to a low sodium diet. QUICK TIPS  Do not add salt to food.  Avoid convenience items and fast food.  Choose unsalted snack foods.  Buy lower sodium products, often labeled as "lower sodium" or "no salt added."  Check food labels to learn how much sodium is in 1 serving.  When eating at a restaurant, ask that your food be prepared with less salt or none, if possible. READING FOOD LABELS FOR SODIUM INFORMATION The nutrition facts label is a good place to find how much sodium is in foods. Look for products with no more than 500 to 600 mg of sodium per meal and no more than 150 mg per serving. Remember that 2 g = 2000 mg. The food label may also list foods as:  Sodium-free: Less than 5 mg in a serving.  Very low sodium: 35 mg or less in a serving.  Low-sodium: 140 mg or less in a serving.  Light in sodium: 50% less sodium in a serving. For example, if a food that usually has 300 mg of sodium is changed to become light in sodium, it will have 150 mg of sodium.  Reduced sodium: 25% less sodium in a serving. For example, if a food that usually has 400 mg of sodium is  changed to reduced sodium, it will have 300 mg of sodium. CHOOSING FOODS Grains  Avoid: Salted crackers and snack items. Some cereals, including instant hot cereals. Bread stuffing and biscuit mixes. Seasoned rice or pasta mixes.  Choose: Unsalted snack items. Low-sodium cereals, oats, puffed wheat and rice, shredded wheat. English muffins and bread. Pasta. Meats  Avoid: Salted, canned, smoked, spiced, pickled meats, including fish and poultry. Bacon, ham, sausage, cold cuts, hot dogs, anchovies.  Choose: Low-sodium canned tuna and salmon. Fresh or frozen meat, poultry, and fish. Dairy  Avoid: Processed cheese and spreads. Cottage cheese. Buttermilk and condensed milk. Regular cheese.  Choose: Milk. Low-sodium cottage cheese. Yogurt. Sour cream. Low-sodium cheese. Fruits and Vegetables  Avoid: Regular canned vegetables. Regular canned tomato sauce and paste. Frozen vegetables in sauces. Olives. Rosita FirePickles. Relishes. Sauerkraut.  Choose: Low-sodium canned vegetables. Low-sodium tomato sauce and paste. Frozen or fresh vegetables. Fresh and frozen fruit. Condiments  Avoid: Canned and packaged gravies. Worcestershire sauce. Tartar sauce. Barbecue sauce. Soy sauce. Steak sauce. Ketchup. Onion, garlic, and table salt. Meat flavorings and tenderizers.  Choose: Fresh and dried herbs and spices. Low-sodium varieties of mustard and ketchup. Lemon juice. Tabasco sauce. Horseradish. SAMPLE 2 GRAM SODIUM MEAL PLAN Breakfast / Sodium (mg)  1 cup low-fat milk / 143 mg  2 slices whole-wheat toast /  270 mg  1 tbs heart-healthy margarine / 153 mg  1 hard-boiled egg / 139 mg  1 small orange / 0 mg Lunch / Sodium (mg)  1 cup raw carrots / 76 mg   cup hummus / 298 mg  1 cup low-fat milk / 143 mg   cup red grapes / 2 mg  1 whole-wheat pita bread / 356 mg Dinner / Sodium (mg)  1 cup whole-wheat pasta / 2 mg  1 cup low-sodium tomato sauce / 73 mg  3 oz lean ground beef / 57 mg  1  small side salad (1 cup raw spinach leaves,  cup cucumber,  cup yellow bell pepper) with 1 tsp olive oil and 1 tsp red wine vinegar / 25 mg Snack / Sodium (mg)  1 container low-fat vanilla yogurt / 107 mg  3 graham cracker squares / 127 mg Nutrient Analysis  Calories: 2033  Protein: 77 g  Carbohydrate: 282 g  Fat: 72 g  Sodium: 1971 mg Document Released: 04/24/2005 Document Revised: 07/17/2011 Document Reviewed: 07/26/2009 Exit[employer] Patient Information 2014 [place].

## 2013-06-18 NOTE — Assessment & Plan Note (Signed)
Blood pressure was initially high but recheck came down to 150/56. Her daughter does keep track of her blood pressures and they have been stable at home. Recommend 2 g sodium diet.

## 2013-06-18 NOTE — Assessment & Plan Note (Signed)
Patient's heart failure is currently compensated. She did take extra Lasix Friday and Saturday for edema. She can continue to take Lasix when necessary. We'll check a Bmet today.

## 2013-07-02 ENCOUNTER — Telehealth: Payer: Self-pay | Admitting: Cardiology

## 2013-07-02 MED ORDER — LABETALOL HCL 100 MG PO TABS: 100.0000 mg | ORAL_TABLET | Freq: Two times a day (BID) | ORAL | Status: AC

## 2013-07-02 NOTE — Telephone Encounter (Signed)
labetolol refill complete

## 2013-07-02 NOTE — Telephone Encounter (Signed)
Needs refill on Labetolol to [facility] / tgs

## 2013-07-04 ENCOUNTER — Encounter: Payer: Self-pay | Admitting: *Deleted

## 2013-07-14 ENCOUNTER — Encounter: Payer: Self-pay | Admitting: Internal Medicine

## 2013-07-14 ENCOUNTER — Encounter: Payer: Medicare Other | Admitting: Internal Medicine

## 2013-08-15 ENCOUNTER — Ambulatory Visit (INDEPENDENT_AMBULATORY_CARE_PROVIDER_SITE_OTHER): Payer: Medicare Other | Admitting: Internal Medicine

## 2013-08-15 ENCOUNTER — Encounter: Payer: Self-pay | Admitting: Internal Medicine

## 2013-08-15 VITALS — BP 178/83 | HR 92 | Ht 65.0 in | Wt 115.8 lb

## 2013-08-15 DIAGNOSIS — I4891 Unspecified atrial fibrillation: Secondary | ICD-10-CM

## 2013-08-15 DIAGNOSIS — I442 Atrioventricular block, complete: Secondary | ICD-10-CM

## 2013-08-15 DIAGNOSIS — I2510 Atherosclerotic heart disease of native coronary artery without angina pectoris: Secondary | ICD-10-CM

## 2013-08-15 LAB — MDC_IDC_ENUM_SESS_TYPE_INCLINIC
Battery Impedance: 100 Ohm
Battery Voltage: 2.8 V
Brady Statistic AP VS Percent: 0 %
Brady Statistic AS VP Percent: 43 %
Brady Statistic AS VS Percent: 0 %
Date Time Interrogation Session: 20150410114045
Lead Channel Impedance Value: 757 Ohm
Lead Channel Pacing Threshold Amplitude: 0.5 V
Lead Channel Pacing Threshold Amplitude: 0.5 V
Lead Channel Pacing Threshold Pulse Width: 0.4 ms
Lead Channel Setting Pacing Amplitude: 1.5 V
Lead Channel Setting Pacing Amplitude: 2 V
Lead Channel Setting Sensing Sensitivity: 5.6 mV
MDC IDC MSMT BATTERY REMAINING LONGEVITY: 134 mo
MDC IDC MSMT LEADCHNL RA IMPEDANCE VALUE: 461 Ohm
MDC IDC MSMT LEADCHNL RA SENSING INTR AMPL: 2 mV
MDC IDC MSMT LEADCHNL RV PACING THRESHOLD PULSEWIDTH: 0.4 ms
MDC IDC MSMT LEADCHNL RV SENSING INTR AMPL: 15.67 mV
MDC IDC SET LEADCHNL RV PACING PULSEWIDTH: 0.4 ms
MDC IDC STAT BRADY AP VP PERCENT: 57 %

## 2013-08-15 NOTE — Patient Instructions (Addendum)
Your physician recommends that you schedule a follow-up appointment in: 8 months with Dr Court Joyaylor You will receive a reminder letter two months in advance reminding you to call and schedule your appointment. If you don't receive this letter, please contact our office.  Remote monitoring is used to monitor your Pacemaker or ICD from home. This monitoring reduces the number of office visits required to check your device to one time per year. It allows us to keep an eye on the functioning of your device to ensure it is working properly. You are scheduled for a device check from home on 11-17-13. You may send your transmission at any time that day. If you have a wireless device, the transmission will be sent automatically. After your physician reviews your transmission, you will receive a postcard with your next transmission date.

## 2013-08-17 ENCOUNTER — Encounter: Payer: Self-pay | Admitting: Internal Medicine

## 2013-08-17 NOTE — Assessment & Plan Note (Signed)
She denies anginal symptoms despite her remote revascularization and uncontrolled blood pressure. She is on both labetolol and hydralazine and a diuretic. I have asked her to followup with her primary MD for additional blood pressure treatment.

## 2013-08-17 NOTE — Assessment & Plan Note (Signed)
She is maintaining NSR 96% of the time. She will remain on Eliquis but no anti-arrythmic meds. She failed Tikosyn.

## 2013-08-17 NOTE — Progress Notes (Signed)
HPI Brittany Hickman returns today for followup. She is a pleasant 80 yo woman with a h/o CAD, CHB, s/p PPM, HTN, and atrial fibrillation. In t25he interim, she has been stable. No chest pain or syncope. No edema. Minimal peripheral edema. No Known Allergies   Current Outpatient Prescriptions  Medication Sig Dispense Refill  . apixaban (ELIQUIS) 2.5 MG TABS tablet Take 1 tablet (2.5 mg total) by mouth 2 (two) times daily.  60 tablet  6  . CALCIUM PO Take 1 tablet by mouth daily.      Marland Kitchen. estradiol (ESTRACE) 0.1 MG/GM vaginal cream Place 1 Applicatorful vaginally once a week. On Saturdays      . furosemide (LASIX) 40 MG tablet Take 40 mg by mouth as needed (for swelling 3lbs gain in one day or 5lbs in 5 days).      . hydrALAZINE (APRESOLINE) 25 MG tablet Take 1 tablet (25 mg total) by mouth every 8 (eight) hours.  90 tablet  6  . labetalol (NORMODYNE) 100 MG tablet Take 1 tablet (100 mg total) by mouth 2 (two) times daily.  180 tablet  3  . Multiple Vitamins-Minerals (CENTRUM SILVER ADULT 50+) TABS Take by mouth daily.      . nitroGLYCERIN (NITROSTAT) 0.4 MG SL tablet Place 1 tablet (0.4 mg total) under the tongue every 5 (five) minutes x 3 doses as needed for chest pain.  25 tablet  3  . potassium chloride (K-DUR) 10 MEQ tablet Take 10 mEq by mouth as needed (WITH LASIX).      Marland Kitchen. raloxifene (EVISTA) 60 MG tablet Take 60 mg by mouth daily.       No current facility-administered medications for this visit.     Past Medical History  Diagnosis Date  . CAD (coronary artery disease)     a. s/p 5 vessel CABG in 1999.  Marland Kitchen. HTN (hypertension)   . Hyperlipidemia   . Osteoarthritis   . Osteoporosis   . Mobitz II     a. 04/2013: s/p MDT Sensia DC PPM model SEDR0 1 (ser # [id number] H  . Atrial fibrillation     a. Dx 04/2013-->ibutilide cardioversion-->recurrent afib within hours-->eliquis initiated.    ROS:   All systems reviewed and negative except as noted in the HPI.   Past Surgical History    Procedure Laterality Date  . Cholecystectomy    . Bladder suspension    . Rectal prolapse repair    . Pacemaker insertion  04-08-2013    MDT SEDR01 implanted by Dr Brittany Hickman for Mobitz II AV block     Family History  Problem Relation Age of Onset  . Gastric cancer Father   . Stroke Mother   . Diabetes Sister   . Epilepsy    . Breast cancer       History   Social History  . Marital Status: Married    Spouse Name: N/A    Number of Children: N/A  . Years of Education: N/A   Occupational History  . Not on file.   Social History Main Topics  . Smoking status: Never Smoker   . Smokeless tobacco: Never Used  . Alcohol Use: No  . Drug Use: No  . Sexual Activity: No   Other Topics Concern  . Not on file   Social History Narrative   Patient lives in [place] by herself.  She has daughters nearby that are very involved and do her grocery shopping and bring her to church.  BP 178/83  Pulse 92  Ht 5\' 5"  (1.651 m)  Wt 115 lb 12.8 oz (52.527 kg)  BMI 19.27 kg/m2  Physical Exam:  Well appearing elderly woman, NAD HEENT: Unremarkable Neck:  No JVD, no thyromegally Back:  No CVA tenderness Lungs:  Clear with no wheezes HEART:  Regular rate rhythm, no murmurs, no rubs, no clicks Abd:  soft, positive bowel sounds, no organomegally, no rebound, no guarding Ext:  2 plus pulses, no edema, no cyanosis, no clubbing Skin:  No rashes no nodules Neuro:  CN II through XII intact, motor grossly intact   DEVICE  Normal device function.  See PaceArt for details.   Assess/Plan:

## 2013-08-25 ENCOUNTER — Encounter: Payer: Self-pay | Admitting: Internal Medicine

## 2013-11-17 ENCOUNTER — Encounter: Payer: Medicare Other | Admitting: *Deleted

## 2013-11-18 ENCOUNTER — Telehealth: Payer: Self-pay | Admitting: Cardiology

## 2013-11-18 NOTE — Telephone Encounter (Signed)
Spoke with pt daughter and confirmed remote transmission. Pt daughter stated that she attempted to send transmission on Monday 11-17-13 and it would not go through. I instructed pt daughter to call tech support she verbalized understanding.

## 2013-11-19 ENCOUNTER — Encounter: Payer: Self-pay | Admitting: Cardiology

## 2013-12-17 ENCOUNTER — Other Ambulatory Visit: Payer: Self-pay | Admitting: Nurse Practitioner

## 2014-02-18 ENCOUNTER — Encounter: Payer: Self-pay | Admitting: Cardiology

## 2014-04-16 ENCOUNTER — Encounter (HOSPITAL_COMMUNITY): Payer: Self-pay | Admitting: Internal Medicine

## 2014-05-10 ENCOUNTER — Emergency Department (HOSPITAL_COMMUNITY): Payer: Medicare Other

## 2014-05-10 ENCOUNTER — Encounter (HOSPITAL_COMMUNITY): Payer: Self-pay | Admitting: Emergency Medicine

## 2014-05-10 ENCOUNTER — Inpatient Hospital Stay (HOSPITAL_COMMUNITY)
Admission: EM | Admit: 2014-05-10 | Discharge: 2014-06-08 | DRG: 193 | Disposition: E | Payer: Medicare Other | Attending: Internal Medicine | Admitting: Internal Medicine

## 2014-05-10 DIAGNOSIS — R404 Transient alteration of awareness: Secondary | ICD-10-CM

## 2014-05-10 DIAGNOSIS — G8929 Other chronic pain: Secondary | ICD-10-CM | POA: Diagnosis present

## 2014-05-10 DIAGNOSIS — Z820 Family history of epilepsy and other diseases of the nervous system: Secondary | ICD-10-CM

## 2014-05-10 DIAGNOSIS — E876 Hypokalemia: Secondary | ICD-10-CM | POA: Diagnosis not present

## 2014-05-10 DIAGNOSIS — I469 Cardiac arrest, cause unspecified: Secondary | ICD-10-CM | POA: Diagnosis not present

## 2014-05-10 DIAGNOSIS — R7989 Other specified abnormal findings of blood chemistry: Secondary | ICD-10-CM

## 2014-05-10 DIAGNOSIS — H9190 Unspecified hearing loss, unspecified ear: Secondary | ICD-10-CM | POA: Diagnosis present

## 2014-05-10 DIAGNOSIS — I2510 Atherosclerotic heart disease of native coronary artery without angina pectoris: Secondary | ICD-10-CM | POA: Diagnosis not present

## 2014-05-10 DIAGNOSIS — I129 Hypertensive chronic kidney disease with stage 1 through stage 4 chronic kidney disease, or unspecified chronic kidney disease: Secondary | ICD-10-CM | POA: Diagnosis present

## 2014-05-10 DIAGNOSIS — R0902 Hypoxemia: Secondary | ICD-10-CM | POA: Diagnosis not present

## 2014-05-10 DIAGNOSIS — I4892 Unspecified atrial flutter: Secondary | ICD-10-CM | POA: Diagnosis not present

## 2014-05-10 DIAGNOSIS — R296 Repeated falls: Secondary | ICD-10-CM | POA: Diagnosis not present

## 2014-05-10 DIAGNOSIS — Z823 Family history of stroke: Secondary | ICD-10-CM

## 2014-05-10 DIAGNOSIS — R4182 Altered mental status, unspecified: Secondary | ICD-10-CM | POA: Diagnosis present

## 2014-05-10 DIAGNOSIS — Z951 Presence of aortocoronary bypass graft: Secondary | ICD-10-CM | POA: Diagnosis not present

## 2014-05-10 DIAGNOSIS — E785 Hyperlipidemia, unspecified: Secondary | ICD-10-CM | POA: Diagnosis not present

## 2014-05-10 DIAGNOSIS — I509 Heart failure, unspecified: Secondary | ICD-10-CM | POA: Diagnosis present

## 2014-05-10 DIAGNOSIS — S8001XA Contusion of right knee, initial encounter: Secondary | ICD-10-CM | POA: Diagnosis not present

## 2014-05-10 DIAGNOSIS — J189 Pneumonia, unspecified organism: Secondary | ICD-10-CM | POA: Diagnosis not present

## 2014-05-10 DIAGNOSIS — M810 Age-related osteoporosis without current pathological fracture: Secondary | ICD-10-CM | POA: Diagnosis present

## 2014-05-10 DIAGNOSIS — W19XXXA Unspecified fall, initial encounter: Secondary | ICD-10-CM | POA: Diagnosis not present

## 2014-05-10 DIAGNOSIS — Z7901 Long term (current) use of anticoagulants: Secondary | ICD-10-CM | POA: Diagnosis not present

## 2014-05-10 DIAGNOSIS — Z833 Family history of diabetes mellitus: Secondary | ICD-10-CM | POA: Diagnosis not present

## 2014-05-10 DIAGNOSIS — N179 Acute kidney failure, unspecified: Secondary | ICD-10-CM

## 2014-05-10 DIAGNOSIS — R55 Syncope and collapse: Secondary | ICD-10-CM | POA: Diagnosis not present

## 2014-05-10 DIAGNOSIS — R000 Tachycardia, unspecified: Secondary | ICD-10-CM | POA: Diagnosis not present

## 2014-05-10 DIAGNOSIS — E872 Acidosis, unspecified: Secondary | ICD-10-CM

## 2014-05-10 DIAGNOSIS — Z800 Family history of malignant neoplasm of digestive organs: Secondary | ICD-10-CM | POA: Diagnosis not present

## 2014-05-10 DIAGNOSIS — Z950 Presence of cardiac pacemaker: Secondary | ICD-10-CM | POA: Diagnosis not present

## 2014-05-10 DIAGNOSIS — Z803 Family history of malignant neoplasm of breast: Secondary | ICD-10-CM | POA: Diagnosis not present

## 2014-05-10 DIAGNOSIS — F0390 Unspecified dementia without behavioral disturbance: Secondary | ICD-10-CM | POA: Diagnosis not present

## 2014-05-10 DIAGNOSIS — T68XXXA Hypothermia, initial encounter: Secondary | ICD-10-CM | POA: Diagnosis not present

## 2014-05-10 DIAGNOSIS — N390 Urinary tract infection, site not specified: Secondary | ICD-10-CM | POA: Diagnosis not present

## 2014-05-10 DIAGNOSIS — R269 Unspecified abnormalities of gait and mobility: Secondary | ICD-10-CM | POA: Diagnosis not present

## 2014-05-10 DIAGNOSIS — I442 Atrioventricular block, complete: Secondary | ICD-10-CM | POA: Diagnosis present

## 2014-05-10 DIAGNOSIS — N183 Chronic kidney disease, stage 3 unspecified: Secondary | ICD-10-CM | POA: Diagnosis present

## 2014-05-10 DIAGNOSIS — D649 Anemia, unspecified: Secondary | ICD-10-CM | POA: Diagnosis present

## 2014-05-10 DIAGNOSIS — S022XXA Fracture of nasal bones, initial encounter for closed fracture: Secondary | ICD-10-CM | POA: Diagnosis not present

## 2014-05-10 DIAGNOSIS — Z66 Do not resuscitate: Secondary | ICD-10-CM | POA: Diagnosis present

## 2014-05-10 DIAGNOSIS — M79604 Pain in right leg: Secondary | ICD-10-CM | POA: Clinically undetermined

## 2014-05-10 DIAGNOSIS — I248 Other forms of acute ischemic heart disease: Secondary | ICD-10-CM | POA: Diagnosis not present

## 2014-05-10 DIAGNOSIS — M6282 Rhabdomyolysis: Secondary | ICD-10-CM | POA: Diagnosis present

## 2014-05-10 DIAGNOSIS — E860 Dehydration: Secondary | ICD-10-CM | POA: Diagnosis not present

## 2014-05-10 DIAGNOSIS — I10 Essential (primary) hypertension: Secondary | ICD-10-CM | POA: Diagnosis present

## 2014-05-10 DIAGNOSIS — M1990 Unspecified osteoarthritis, unspecified site: Secondary | ICD-10-CM | POA: Diagnosis not present

## 2014-05-10 DIAGNOSIS — E669 Obesity, unspecified: Secondary | ICD-10-CM | POA: Diagnosis not present

## 2014-05-10 DIAGNOSIS — S299XXA Unspecified injury of thorax, initial encounter: Secondary | ICD-10-CM | POA: Diagnosis not present

## 2014-05-10 DIAGNOSIS — I4891 Unspecified atrial fibrillation: Secondary | ICD-10-CM | POA: Diagnosis not present

## 2014-05-10 DIAGNOSIS — R778 Other specified abnormalities of plasma proteins: Secondary | ICD-10-CM

## 2014-05-10 DIAGNOSIS — I2489 Other forms of acute ischemic heart disease: Secondary | ICD-10-CM | POA: Diagnosis present

## 2014-05-10 DIAGNOSIS — I482 Chronic atrial fibrillation: Secondary | ICD-10-CM | POA: Diagnosis not present

## 2014-05-10 DIAGNOSIS — G92 Toxic encephalopathy: Secondary | ICD-10-CM | POA: Diagnosis not present

## 2014-05-10 LAB — URINE MICROSCOPIC-ADD ON

## 2014-05-10 LAB — CBC WITH DIFFERENTIAL/PLATELET
BASOS ABS: 0 10*3/uL (ref 0.0–0.1)
BASOS PCT: 0 % (ref 0–1)
EOS ABS: 0 10*3/uL (ref 0.0–0.7)
Eosinophils Relative: 0 % (ref 0–5)
HEMATOCRIT: 46.5 % — AB (ref 36.0–46.0)
HEMOGLOBIN: 15.2 g/dL — AB (ref 12.0–15.0)
Lymphocytes Relative: 10 % — ABNORMAL LOW (ref 12–46)
Lymphs Abs: 1 10*3/uL (ref 0.7–4.0)
MCH: 27.7 pg (ref 26.0–34.0)
MCHC: 32.7 g/dL (ref 30.0–36.0)
MCV: 84.7 fL (ref 78.0–100.0)
Monocytes Absolute: 0.6 10*3/uL (ref 0.1–1.0)
Monocytes Relative: 6 % (ref 3–12)
NEUTROS ABS: 8.6 10*3/uL — AB (ref 1.7–7.7)
NEUTROS PCT: 84 % — AB (ref 43–77)
PLATELETS: 122 10*3/uL — AB (ref 150–400)
RBC: 5.49 MIL/uL — ABNORMAL HIGH (ref 3.87–5.11)
RDW: 17.2 % — ABNORMAL HIGH (ref 11.5–15.5)
WBC: 10.1 10*3/uL (ref 4.0–10.5)

## 2014-05-10 LAB — MAGNESIUM: Magnesium: 1.7 mg/dL (ref 1.5–2.5)

## 2014-05-10 LAB — APTT: aPTT: 39 seconds — ABNORMAL HIGH (ref 24–37)

## 2014-05-10 LAB — BASIC METABOLIC PANEL
Anion gap: 13 (ref 5–15)
BUN: 37 mg/dL — AB (ref 6–23)
CO2: 18 mmol/L — ABNORMAL LOW (ref 19–32)
Calcium: 9.4 mg/dL (ref 8.4–10.5)
Chloride: 106 mEq/L (ref 96–112)
Creatinine, Ser: 2.2 mg/dL — ABNORMAL HIGH (ref 0.50–1.10)
GFR calc Af Amer: 21 mL/min — ABNORMAL LOW (ref >90)
GFR, EST NON AFRICAN AMERICAN: 18 mL/min — AB (ref >90)
GLUCOSE: 165 mg/dL — AB (ref 70–99)
Potassium: 4.3 mmol/L (ref 3.5–5.1)
Sodium: 137 mmol/L (ref 135–145)

## 2014-05-10 LAB — URINALYSIS, ROUTINE W REFLEX MICROSCOPIC
Glucose, UA: NEGATIVE mg/dL
Ketones, ur: NEGATIVE mg/dL
NITRITE: POSITIVE — AB
PH: 5 (ref 5.0–8.0)
PROTEIN: 100 mg/dL — AB
Specific Gravity, Urine: >1.03 — ABNORMAL HIGH (ref 1.005–1.030)
UROBILINOGEN UA: 1 mg/dL (ref 0.0–1.0)

## 2014-05-10 LAB — PROTIME-INR
INR: 1.71 — ABNORMAL HIGH (ref 0.00–1.49)
Prothrombin Time: 20.2 seconds — ABNORMAL HIGH (ref 11.6–15.2)

## 2014-05-10 LAB — TSH
TSH: 4.427 u[IU]/mL (ref 0.350–4.500)
TSH: 2.999 u[IU]/mL (ref 0.350–4.500)

## 2014-05-10 LAB — TROPONIN I
Troponin I: 0.06 ng/mL — ABNORMAL HIGH (ref <0.031)
Troponin I: 0.06 ng/mL — ABNORMAL HIGH (ref <0.031)

## 2014-05-10 LAB — MRSA PCR SCREENING: MRSA BY PCR: NEGATIVE

## 2014-05-10 LAB — CK: Total CK: 932 U/L — ABNORMAL HIGH (ref 7–177)

## 2014-05-10 MED ORDER — CETYLPYRIDINIUM CHLORIDE 0.05 % MT LIQD
7.0000 mL | Freq: Two times a day (BID) | OROMUCOSAL | Status: DC
  Administered 2014-05-10 – 2014-05-11 (×3): 7 mL via OROMUCOSAL

## 2014-05-10 MED ORDER — ASPIRIN 81 MG PO CHEW
324.0000 mg | CHEWABLE_TABLET | Freq: Once | ORAL | Status: AC
  Administered 2014-05-10: 324 mg via ORAL
  Filled 2014-05-10: qty 4

## 2014-05-10 MED ORDER — DILTIAZEM LOAD VIA INFUSION
10.0000 mg | Freq: Once | INTRAVENOUS | Status: AC
  Administered 2014-05-10: 10 mg via INTRAVENOUS
  Filled 2014-05-10: qty 10

## 2014-05-10 MED ORDER — CEFTRIAXONE SODIUM IN DEXTROSE 20 MG/ML IV SOLN
1.0000 g | INTRAVENOUS | Status: DC
  Administered 2014-05-11: 1 g via INTRAVENOUS
  Filled 2014-05-10 (×2): qty 50

## 2014-05-10 MED ORDER — INFLUENZA VAC SPLIT QUAD 0.5 ML IM SUSY
0.5000 mL | PREFILLED_SYRINGE | INTRAMUSCULAR | Status: AC
  Administered 2014-05-11: 0.5 mL via INTRAMUSCULAR
  Filled 2014-05-10: qty 0.5

## 2014-05-10 MED ORDER — SODIUM CHLORIDE 0.9 % IV SOLN
INTRAVENOUS | Status: DC
  Administered 2014-05-10: 1000 mL via INTRAVENOUS

## 2014-05-10 MED ORDER — ONDANSETRON HCL 4 MG PO TABS: 4.0000 mg | ORAL_TABLET | Freq: Four times a day (QID) | ORAL | Status: DC | PRN

## 2014-05-10 MED ORDER — DEXTROSE 5 % IV SOLN
1.0000 g | Freq: Once | INTRAVENOUS | Status: AC
  Administered 2014-05-10: 1 g via INTRAVENOUS
  Filled 2014-05-10: qty 10

## 2014-05-10 MED ORDER — SODIUM CHLORIDE 0.9 % IJ SOLN
3.0000 mL | Freq: Two times a day (BID) | INTRAMUSCULAR | Status: DC
  Administered 2014-05-10 – 2014-05-11 (×3): 3 mL via INTRAVENOUS

## 2014-05-10 MED ORDER — LABETALOL HCL 200 MG PO TABS
100.0000 mg | ORAL_TABLET | Freq: Two times a day (BID) | ORAL | Status: DC
  Administered 2014-05-10 – 2014-05-11 (×2): 100 mg via ORAL
  Filled 2014-05-10 (×2): qty 1

## 2014-05-10 MED ORDER — APIXABAN 5 MG PO TABS
2.5000 mg | ORAL_TABLET | Freq: Two times a day (BID) | ORAL | Status: DC
  Administered 2014-05-10 – 2014-05-11 (×3): 2.5 mg via ORAL
  Filled 2014-05-10 (×3): qty 1

## 2014-05-10 MED ORDER — HYDRALAZINE HCL 25 MG PO TABS
25.0000 mg | ORAL_TABLET | Freq: Three times a day (TID) | ORAL | Status: DC
  Administered 2014-05-11: 25 mg via ORAL
  Filled 2014-05-10 (×2): qty 1

## 2014-05-10 MED ORDER — RALOXIFENE HCL 60 MG PO TABS
60.0000 mg | ORAL_TABLET | Freq: Every day | ORAL | Status: DC
  Administered 2014-05-11: 60 mg via ORAL
  Filled 2014-05-10: qty 1

## 2014-05-10 MED ORDER — AZITHROMYCIN 500 MG IV SOLR
500.0000 mg | INTRAVENOUS | Status: DC
  Administered 2014-05-11: 500 mg via INTRAVENOUS
  Filled 2014-05-10 (×2): qty 500

## 2014-05-10 MED ORDER — NITROGLYCERIN 0.4 MG SL SUBL: 0.4000 mg | SUBLINGUAL_TABLET | SUBLINGUAL | Status: DC | PRN

## 2014-05-10 MED ORDER — ENSURE COMPLETE PO LIQD: 237.0000 mL | Freq: Two times a day (BID) | ORAL | Status: DC

## 2014-05-10 MED ORDER — DEXTROSE 5 % IV SOLN
5.0000 mg/h | INTRAVENOUS | Status: DC
  Administered 2014-05-10: 5 mg/h via INTRAVENOUS
  Filled 2014-05-10: qty 100

## 2014-05-10 MED ORDER — DEXTROSE 5 % IV SOLN
500.0000 mg | Freq: Once | INTRAVENOUS | Status: AC
  Administered 2014-05-10: 500 mg via INTRAVENOUS
  Filled 2014-05-10: qty 500

## 2014-05-10 MED ORDER — ONDANSETRON HCL 4 MG/2ML IJ SOLN
4.0000 mg | Freq: Four times a day (QID) | INTRAMUSCULAR | Status: DC | PRN
Start: 2014-05-10 — End: 2014-05-12
  Administered 2014-05-10 – 2014-05-11 (×2): 4 mg via INTRAVENOUS
  Filled 2014-05-10 (×2): qty 2

## 2014-05-10 MED ORDER — SODIUM CHLORIDE 0.9 % IJ SOLN
3.0000 mL | Freq: Two times a day (BID) | INTRAMUSCULAR | Status: DC
  Administered 2014-05-11: 3 mL via INTRAVENOUS

## 2014-05-10 NOTE — ED Notes (Signed)
Patient states she was walking to the restroom this morning, and fell hitting her face on an entertainment center. Patient states laid in the floor until this afternoon. At this time, patient denies pain, presents with bruising to bridge of nose, with small abrasions, bruise to right posterior shoulder and superficial abrasion, also a superficial abrasion to left upper back.

## 2014-05-10 NOTE — H&P (Signed)
[facility] History and Physical  Brittany Hickman ZOX:[id number] DOB: [birth year] DOA: May 28, 2014  Referring physician: ER PCP: Selinda Flavin, MD   Chief Complaint: Altered mental status  HPI: Brittany Hickman is a 81 y.o. female  This is a 81 year old lady who lives in her own who was found on her floor next to the door sitting upright with a large bruise to her nose and under her eyes. The patient cannot relay how this happened to her daughter's who described a history. It was apparent that she had fallen but could not give the daughters anymore details. The daughter spent most of the day with the patient and felt that she was getting better but then she had another fall at which point she apparently lost consciousness. Therefore, they reported to the emergency room. He also noticed that she was confused throughout the day and not her usual self. She would not recognized people that she had known for years. When she was evaluated in the emergency room she was found to be in atrial flutter/fibrillation with an increased ventricular rate and was placed on Cardizem drip. She was also found to be relatively hypothermic. Chest x-ray was suggestive of a left-sided lower lobe pneumonia. She felt weak all over. She is now being admitted for further management. The patient herself cannot really give a good history due to her altered mental status.   Review of Systems:  Unable to obtain review of systems because of her altered mental status. In particular, the daughters tell me that she has had difficulty with memory over the last few months and this has been getting worse.  Past Medical History  Diagnosis Date  . CAD (coronary artery disease)     a. s/p 5 vessel CABG in 1999.  Marland Kitchen HTN (hypertension)   . Hyperlipidemia   . Osteoarthritis   . Osteoporosis   . Mobitz II     a. 04/2013: s/p MDT Sensia DC PPM model SEDR0 1 (ser # [id number]  . Atrial fibrillation     a. Dx 04/2013-->ibutilide  cardioversion-->recurrent afib within hours-->eliquis initiated.   Past Surgical History  Procedure Laterality Date  . Cholecystectomy    . Bladder suspension    . Rectal prolapse repair    . Pacemaker insertion  04-08-2013    MDT [id number] implanted by Dr Johney Frame for Mobitz II AV block  . Permanent pacemaker insertion N/A 04/08/2013    Procedure: PERMANENT PACEMAKER INSERTION;  Surgeon: Gardiner Rhyme, MD;  Location: [facility] CATH [facility];  Service: Cardiovascular;  Laterality: N/A;   Social History:  reports that she has never smoked. She has never used smokeless tobacco. She reports that she does not drink alcohol or use illicit drugs.  No Known Allergies  Family History  Problem Relation Age of Onset  . Gastric cancer Father   . Stroke Mother   . Diabetes Sister   . Epilepsy    . Breast cancer       Prior to Admission medications   Medication Sig Start Date End Date Taking? Authorizing Provider  CALCIUM PO Take 1 tablet by mouth daily.   Yes Historical Provider, MD  ELIQUIS 2.5 MG TABS tablet take 1 tablet twice a day 12/18/13  Yes Marinus Maw, MD  furosemide (LASIX) 40 MG tablet Take 40 mg by mouth as needed (for swelling 3lbs gain in one day or 5lbs in 5 days).   Yes Historical Provider, MD  hydrALAZINE (APRESOLINE) 25 MG tablet Take 1 tablet (25  mg total) by mouth every 8 (eight) hours. 04/26/13  Yes Leroy Sea, MD  labetalol (NORMODYNE) 100 MG tablet Take 1 tablet (100 mg total) by mouth 2 (two) times daily. 07/02/13  Yes Dyann Kief, PA-C  Multiple Vitamins-Minerals (CENTRUM SILVER ADULT 50+) TABS Take by mouth daily.   Yes Historical Provider, MD  nitroGLYCERIN (NITROSTAT) 0.4 MG SL tablet Place 1 tablet (0.4 mg total) under the tongue every 5 (five) minutes x 3 doses as needed for chest pain. 04/10/13  Yes Ok Anis, NP  potassium chloride (K-DUR) 10 MEQ tablet Take 10 mEq by mouth as needed (WITH LASIX).   Yes Historical Provider, MD  raloxifene (EVISTA) 60 MG  tablet Take 60 mg by mouth daily.   Yes Historical Provider, MD   Physical Exam: Filed Vitals:   30-May-2014 1930 05-30-2014 1943 05-30-14 2000 2014-05-30 2102  BP: 126/64  137/60   Pulse: 62  52   Temp:  95.8 F (35.4 C)  94.8 F (34.9 C)  TempSrc:  Rectal  Rectal  Resp: 15  18 24   Height:      Weight:      SpO2: 98%  99%     Wt Readings from Last 3 Encounters:  2014-05-30 52.164 kg (115 lb)  08/15/13 52.527 kg (115 lb 12.8 oz)  06/18/13 49.442 kg (109 lb)    General:  Appears calm and comfortable. Somewhat confused. She is hard of hearing so it is difficult sometimes to differentiate confusion versus deafness. She does not quite appreciate that she is in the hospital. She does not know what month it is. She did recognize her daughters. She is somewhat hypothermic. Peripheries are slightly cool but not excessively so. Eyes: PERRL, normal lids, irises & conjunctiva ENT: grossly normal hearing, lips & tongue Neck: no LAD, masses or thyromegaly Cardiovascular: Irregular, consistent with atrial fibrillation. Telemetry: Atrial fibrillation versus atrial flutter. Rate is better controlled with diltiazem drip. Respiratory: Reduced air entry in the left lower zone. No significant crackles or wheezing. Abdomen: soft, ntnd Skin: no rash or induration seen on limited exam Musculoskeletal: grossly normal tone BUE/BLE Psychiatric: Not examined. Neurologic: grossly non-focal. confused as described above.           Labs on Admission:  Basic Metabolic Panel:  Recent Labs Lab May 30, 2014 1847  NA 137  K 4.3  CL 106  CO2 18*  GLUCOSE 165*  BUN 37*  CREATININE 2.20*  CALCIUM 9.4  MG 1.7   Liver Function Tests: No results for input(s): AST, ALT, ALKPHOS, BILITOT, PROT, ALBUMIN in the last 168 hours. No results for input(s): LIPASE, AMYLASE in the last 168 hours. No results for input(s): AMMONIA in the last 168 hours. CBC:  Recent Labs Lab 30-May-2014 1847  WBC 10.1  NEUTROABS 8.6*  HGB  15.2*  HCT 46.5*  MCV 84.7  PLT 122*   Cardiac Enzymes:  Recent Labs Lab May 30, 2014 1847  CKTOTAL 932*  TROPONINI 0.06*    BNP (last 3 results) No results for input(s): PROBNP in the last 8760 hours. CBG: No results for input(s): GLUCAP in the last 168 hours.  Radiological Exams on Admission: Dg Chest 1 View  May 30, 2014   CLINICAL DATA:  Fall hitting face and inner tendon centered. Found on the floor.  EXAM: CHEST - 1 VIEW  COMPARISON:  One-view chest 04/23/2013.  FINDINGS: The heart is enlarged. The patient is status post median sternotomy for CABG. Pacing wires are stable in position. A new left pleural  effusion is present. Left basilar airspace disease is noted as well. The right lung is clear. Mild pulmonary vascular congestion is noted. Scarring at the lung apices is stable.  IMPRESSION: 1. New left pleural effusion and associated airspace disease. This is most concerning for infection. 2. Asymmetric pulmonary vascular congestion on the left. An element of congestive heart failure is also considered. 3. The right lung is clear.   Electronically Signed   By: Gennette Pac M.D.   On: 05/23/14 19:19   Ct Head Wo Contrast  May 23, 2014   CLINICAL DATA:  Facial injury after multiple falls at home today.  EXAM: CT HEAD WITHOUT CONTRAST  CT MAXILLOFACIAL WITHOUT CONTRAST  CT CERVICAL SPINE WITHOUT CONTRAST  TECHNIQUE: Multidetector CT imaging of the head, cervical spine, and maxillofacial structures were performed using the standard protocol without intravenous contrast. Multiplanar CT image reconstructions of the cervical spine and maxillofacial structures were also generated.  COMPARISON:  CT scan of head of April 07, 2013.  FINDINGS: CT HEAD FINDINGS  Bony calvarium appears intact. Mild diffuse cortical atrophy is noted. Mild chronic ischemic white matter disease is noted. No mass effect or midline shift is noted. Ventricular size is within normal limits. There is no evidence of mass lesion,  hemorrhage or acute infarction.  CT MAXILLOFACIAL FINDINGS  Mildly depressed nasal bone fracture is noted. Paranasal sinuses appear normal. Pterygoid plates appear normal. No other fracture is noted. Ostiomeatal complexes are widely patent. Globes and orbits appear normal.  CT CERVICAL SPINE FINDINGS  No fracture or spondylolisthesis is noted. Mild hypertrophy of posterior facet joints is noted. Degenerative disc disease is noted at C6-7 and C7-T1 with anterior osteophyte formation.  IMPRESSION: Mild diffuse cortical atrophy. Mild chronic ischemic white matter disease. No acute intracranial abnormality seen.  Mildly depressed nasal bone fracture is noted.  Degenerative changes as described above in the cervical spine. No acute fracture or spondylolisthesis is noted in the cervical spine.   Electronically Signed   By: Roque Lias M.D.   On: 05/23/2014 19:42   Ct Cervical Spine Wo Contrast  2014-05-23   CLINICAL DATA:  Facial injury after multiple falls at home today.  EXAM: CT HEAD WITHOUT CONTRAST  CT MAXILLOFACIAL WITHOUT CONTRAST  CT CERVICAL SPINE WITHOUT CONTRAST  TECHNIQUE: Multidetector CT imaging of the head, cervical spine, and maxillofacial structures were performed using the standard protocol without intravenous contrast. Multiplanar CT image reconstructions of the cervical spine and maxillofacial structures were also generated.  COMPARISON:  CT scan of head of April 07, 2013.  FINDINGS: CT HEAD FINDINGS  Bony calvarium appears intact. Mild diffuse cortical atrophy is noted. Mild chronic ischemic white matter disease is noted. No mass effect or midline shift is noted. Ventricular size is within normal limits. There is no evidence of mass lesion, hemorrhage or acute infarction.  CT MAXILLOFACIAL FINDINGS  Mildly depressed nasal bone fracture is noted. Paranasal sinuses appear normal. Pterygoid plates appear normal. No other fracture is noted. Ostiomeatal complexes are widely patent. Globes and orbits  appear normal.  CT CERVICAL SPINE FINDINGS  No fracture or spondylolisthesis is noted. Mild hypertrophy of posterior facet joints is noted. Degenerative disc disease is noted at C6-7 and C7-T1 with anterior osteophyte formation.  IMPRESSION: Mild diffuse cortical atrophy. Mild chronic ischemic white matter disease. No acute intracranial abnormality seen.  Mildly depressed nasal bone fracture is noted.  Degenerative changes as described above in the cervical spine. No acute fracture or spondylolisthesis is noted in the cervical spine.  Electronically Signed   By: Roque Lias M.D.   On: 24-May-2014 19:42   Ct Maxillofacial Wo Cm  2014/05/24   CLINICAL DATA:  Facial injury after multiple falls at home today.  EXAM: CT HEAD WITHOUT CONTRAST  CT MAXILLOFACIAL WITHOUT CONTRAST  CT CERVICAL SPINE WITHOUT CONTRAST  TECHNIQUE: Multidetector CT imaging of the head, cervical spine, and maxillofacial structures were performed using the standard protocol without intravenous contrast. Multiplanar CT image reconstructions of the cervical spine and maxillofacial structures were also generated.  COMPARISON:  CT scan of head of April 07, 2013.  FINDINGS: CT HEAD FINDINGS  Bony calvarium appears intact. Mild diffuse cortical atrophy is noted. Mild chronic ischemic white matter disease is noted. No mass effect or midline shift is noted. Ventricular size is within normal limits. There is no evidence of mass lesion, hemorrhage or acute infarction.  CT MAXILLOFACIAL FINDINGS  Mildly depressed nasal bone fracture is noted. Paranasal sinuses appear normal. Pterygoid plates appear normal. No other fracture is noted. Ostiomeatal complexes are widely patent. Globes and orbits appear normal.  CT CERVICAL SPINE FINDINGS  No fracture or spondylolisthesis is noted. Mild hypertrophy of posterior facet joints is noted. Degenerative disc disease is noted at C6-7 and C7-T1 with anterior osteophyte formation.  IMPRESSION: Mild diffuse cortical  atrophy. Mild chronic ischemic white matter disease. No acute intracranial abnormality seen.  Mildly depressed nasal bone fracture is noted.  Degenerative changes as described above in the cervical spine. No acute fracture or spondylolisthesis is noted in the cervical spine.   Electronically Signed   By: Roque Lias M.D.   On: 05-24-14 19:42    EKG: Independently reviewed. Atrial flutter.  Assessment/Plan   1. Altered mental status-I think this is probably multifactorial, consisting of possible pneumonia, head injury concussion and possible hypothermia. She could also have a CVA and this is not evident on current CT scan brain scan. Patient has a pacemaker in situ and I have not ordered MRI brain scan. A repeat CT scan could be done in the next 48 hours. We will consult neurology in the morning. 2. Pneumonia, community-acquired-will treat with antibiotics empirically, although she did not really have any symptoms of pneumonia such as cough, dyspnea or fever.. 3. Atrial flutter/fibrillation with rapid ventricle response-continue with Cardizem drip. She also has pacemaker in situ for heart block. We will consult cardiology in the morning. 4. Acute on chronic kidney disease-gentle IV fluids and follow renal function closely. 5. Hypothermia-warm blanket. Her TSH is 4.4, towards the upper range of normal. I would recheck the TSH. It is certainly not sufficient for myxedema coma. 6. Nasal fracture-no further treatment required. 7. Nontraumatic rhabdomyolysis-secondary to fall, will follow CPK tomorrow. 8. Elevated troponin-she does not really have any symptoms of chest pain and the elevation may be demand ischemia versus elevation with chronic kidney disease.  Further recommendations will depend on patient's hospital progress.   Code Status: DO NOT RESUSCITATE-this was confirmed by both daughters.   DVT Prophylaxis: Continue with Eliquis  Family Communication: I discussed the plan with patient's  daughters at the bedside.   Disposition Plan: Depending on progress.  Time spent: 60 minutes.  Wilson Singer [facility] Pager [phone].

## 2014-05-10 NOTE — ED Notes (Signed)
Per EMS pt fell this am hitting face on entertainment center, pt then found on floor by family this evening. Pt denies pain, states she just keeps falling today.

## 2014-05-10 NOTE — ED Provider Notes (Signed)
TIME SEEN: 6:30 PM  CHIEF COMPLAINT: Multiple falls  HPI: Pt is a 81 y.o. F with history of coronary artery disease status post CABG, hypertension, hyperlipidemia, atrial fibrillation on Eliquis, Mobitz II status post pacemaker placement who presents to the emergency department with 2 falls today. History is limited as patient is extremely hard of hearing. Most of the history is provided by patient's daughters who do not live with the patient to see her every day. They state that they found the patient this morning on the floor next to the door sitting upright with a large bruise to her nose and under her eyes. It was obvious that she had fallen the patient was unable to tell them why she fell or how long she had been down. They state that they were with her most of the day but when they last and then came back this evening they found her on the floor again outside of her bathroom. She stated that time that she thought she may have passed out but is unclear if she passed out before or after her fall. She denies me that she is having any chest pain or shortness of breath but is tachycardiac. Family reports that she was on medications to control her atrial fibrillation but has been taken off of this by Dr. Ladona Ridgel her cardiologist. They deny that she has had any recent fevers but has had 2 days of cough. No vomiting or diarrhea. Patient denies any numbness, tingling or focal weakness. States she feels weak all over.  ROS: See HPI Constitutional: no fever  Eyes: no drainage  ENT: no runny nose   Cardiovascular:  no chest pain  Resp: no SOB  GI: no vomiting GU: no dysuria Integumentary: no rash  Allergy: no hives  Musculoskeletal: no leg swelling  Neurological: no slurred speech ROS otherwise negative  PAST MEDICAL HISTORY/PAST SURGICAL HISTORY:  Past Medical History  Diagnosis Date  . CAD (coronary artery disease)     a. s/p 5 vessel CABG in 1999.  Marland Kitchen HTN (hypertension)   . Hyperlipidemia   .  Osteoarthritis   . Osteoporosis   . Mobitz II     a. 04/2013: s/p MDT Sensia DC PPM model SEDR0 1 (ser # [id number]  . Atrial fibrillation     a. Dx 04/2013-->ibutilide cardioversion-->recurrent afib within hours-->eliquis initiated.    MEDICATIONS:  Prior to Admission medications   Medication Sig Start Date End Date Taking? Authorizing Provider  CALCIUM PO Take 1 tablet by mouth daily.    Historical Provider, MD  ELIQUIS 2.5 MG TABS tablet take 1 tablet twice a day 12/18/13   Marinus Maw, MD  estradiol (ESTRACE) 0.1 MG/GM vaginal cream Place 1 Applicatorful vaginally once a week. On Saturdays    Historical Provider, MD  furosemide (LASIX) 40 MG tablet Take 40 mg by mouth as needed (for swelling 3lbs gain in one day or 5lbs in 5 days).    Historical Provider, MD  hydrALAZINE (APRESOLINE) 25 MG tablet Take 1 tablet (25 mg total) by mouth every 8 (eight) hours. 04/26/13   Leroy Sea, MD  labetalol (NORMODYNE) 100 MG tablet Take 1 tablet (100 mg total) by mouth 2 (two) times daily. 07/02/13   Dyann Kief, PA-C  Multiple Vitamins-Minerals (CENTRUM SILVER ADULT 50+) TABS Take by mouth daily.    Historical Provider, MD  nitroGLYCERIN (NITROSTAT) 0.4 MG SL tablet Place 1 tablet (0.4 mg total) under the tongue every 5 (five) minutes x 3 doses  as needed for chest pain. 04/10/13   Ok Anis, NP  potassium chloride (K-DUR) 10 MEQ tablet Take 10 mEq by mouth as needed (WITH LASIX).    Historical Provider, MD  raloxifene (EVISTA) 60 MG tablet Take 60 mg by mouth daily.    Historical Provider, MD    ALLERGIES:  No Known Allergies  SOCIAL HISTORY:  History  Substance Use Topics  . Smoking status: Never Smoker   . Smokeless tobacco: Never Used  . Alcohol Use: No    FAMILY HISTORY: Family History  Problem Relation Age of Onset  . Gastric cancer Father   . Stroke Mother   . Diabetes Sister   . Epilepsy    . Breast cancer      EXAM: Pulse 128  Temp(Src) 97.4 F (36.3  C)  Resp 18  Ht 5\' 2"  (1.575 m)  Wt 115 lb (52.164 kg)  BMI 21.03 kg/m2  SpO2 100% CONSTITUTIONAL: Alert and oriented and responds appropriately to questions. Well-appearing; well-nourished; GCS 15, elderly, extremely hard of hearing HEAD: Normocephalic; swelling and ecchymosis over the nose with multiple abrasions, there is a small amount of ecchymosis and swelling under her bilateral eyes EYES: Conjunctivae clear, PERRL, EOMI, no hyphema, no subconjunctival hemorrhage ENT: Nose is swollen with ecchymosis and abrasions; no rhinorrhea; moist mucous membranes; pharynx without lesions noted; no dental injury; no septal hematoma NECK: Supple, no meningismus, no LAD; no midline spinal tenderness, step-off or deformity CARD: Regular and tachycardic; S1 and S2 appreciated; no murmurs, no clicks, no rubs, no gallops RESP: Normal chest excursion without splinting or tachypnea; breath sounds clear and equal bilaterally; no wheezes, no rhonchi, no rales; chest wall stable, nontender to palpation ABD/GI: Normal bowel sounds; non-distended; soft, non-tender, no rebound, no guarding PELVIS:  stable, nontender to palpation BACK:  The back appears normal and is non-tender to palpation, there is no CVA tenderness; no midline spinal tenderness, step-off or deformity EXT: Normal ROM in all joints; non-tender to palpation; no edema; normal capillary refill; no cyanosis, small area of ecchymosis to the right shoulder with no bony deformity or loss of fullness or joint effusion SKIN: Normal color for age and race; warm NEURO: Moves all extremities equally, reports normal sensation diffusely, no slurred speech or facial droop, cranial nerves II through XII intact PSYCH: The patient's mood and manner are appropriate. Grooming and personal hygiene are appropriate.  MEDICAL DECISION MAKING: Patient here with 2 falls today. One of these falls may have been secondary to his syncopal event. She is tachycardic and is in a  flutter with 2:1 AV block.  We'll give IV fluids and start diltiazem drip. She denies chest pain or shortness of breath.  We'll obtain troponin, chest x-ray. We'll obtain a CT of her head, cervical spine and face. We'll check basic labs, urinalysis.  ED PROGRESS: Patient's heart rate has improved in the 60s with diltiazem.  Labs show predominant neutrophils but no leukocytosis. She does have mild elevation of her creatinine but this is near her baseline. She does appear to have a metabolic acidosis with a bicarbonate of 18. Otherwise electrolytes normal. TSH normal. Troponin is slightly elevated at 0.06 but suspect this is secondary to strain. Will give aspirin. CK is 932. We'll continue IV hydration. CT head, cervical spine, fascia no mildly depressed nasal bone fracture but no other acute injury. Chest x-ray concerning for community-acquired pneumonia with left-sided pleural effusion. Family reports she has been coughing and she does have a dominant neutrophils. She  is mildly hypothermic in the ED. Will place the air hunger. We'll give ceftriaxone and azithromycin for community-acquired pneumonia. Cultures pending. Updated family that I recommend admission.  Discussed with Dr. Karilyn Cota for admission to step down.      EKG Interpretation  Date/Time:  Sunday 06/03/2014 18:28:51 EST Ventricular Rate:  129 PR Interval:    QRS Duration: 127 QT Interval:  406 QTC Calculation: 595 R Axis:   78 Text Interpretation:  Atrial flutter with predominant 2:1 AV block Left bundle branch block that is old compared to 2014 Confirmed by Paitynn Mikus,  DO, Reinhard Schack ([id number]) on June 03, 2014 6:53:39 PM        CRITICAL CARE Performed by: Raelyn Number   Total critical care time: 45 minutes  Critical care time was exclusive of separately billable procedures and treating other patients.  Critical care was necessary to treat or prevent imminent or life-threatening deterioration.  Critical care was time spent  personally by me on the following activities: development of treatment plan with patient and/or surrogate as well as nursing, discussions with consultants, evaluation of patient's response to treatment, examination of patient, obtaining history from patient or surrogate, ordering and performing treatments and interventions, ordering and review of laboratory studies, ordering and review of radiographic studies, pulse oximetry and re-evaluation of patient's condition.   Brittany Maw Shelsie Tijerino, DO 2014-06-03 2013

## 2014-05-11 ENCOUNTER — Inpatient Hospital Stay (HOSPITAL_COMMUNITY): Payer: Medicare Other

## 2014-05-11 DIAGNOSIS — J189 Pneumonia, unspecified organism: Secondary | ICD-10-CM | POA: Diagnosis not present

## 2014-05-11 DIAGNOSIS — N390 Urinary tract infection, site not specified: Secondary | ICD-10-CM | POA: Diagnosis not present

## 2014-05-11 DIAGNOSIS — T68XXXA Hypothermia, initial encounter: Secondary | ICD-10-CM | POA: Diagnosis not present

## 2014-05-11 DIAGNOSIS — Z800 Family history of malignant neoplasm of digestive organs: Secondary | ICD-10-CM | POA: Diagnosis not present

## 2014-05-11 DIAGNOSIS — N179 Acute kidney failure, unspecified: Secondary | ICD-10-CM | POA: Diagnosis not present

## 2014-05-11 DIAGNOSIS — Z823 Family history of stroke: Secondary | ICD-10-CM | POA: Diagnosis not present

## 2014-05-11 DIAGNOSIS — Z820 Family history of epilepsy and other diseases of the nervous system: Secondary | ICD-10-CM | POA: Diagnosis not present

## 2014-05-11 DIAGNOSIS — M6282 Rhabdomyolysis: Secondary | ICD-10-CM | POA: Diagnosis not present

## 2014-05-11 DIAGNOSIS — Z803 Family history of malignant neoplasm of breast: Secondary | ICD-10-CM | POA: Diagnosis not present

## 2014-05-11 DIAGNOSIS — D649 Anemia, unspecified: Secondary | ICD-10-CM | POA: Diagnosis not present

## 2014-05-11 DIAGNOSIS — I248 Other forms of acute ischemic heart disease: Secondary | ICD-10-CM | POA: Diagnosis present

## 2014-05-11 DIAGNOSIS — I129 Hypertensive chronic kidney disease with stage 1 through stage 4 chronic kidney disease, or unspecified chronic kidney disease: Secondary | ICD-10-CM | POA: Diagnosis not present

## 2014-05-11 DIAGNOSIS — T68XXXD Hypothermia, subsequent encounter: Secondary | ICD-10-CM

## 2014-05-11 DIAGNOSIS — S022XXA Fracture of nasal bones, initial encounter for closed fracture: Secondary | ICD-10-CM | POA: Diagnosis not present

## 2014-05-11 DIAGNOSIS — I509 Heart failure, unspecified: Secondary | ICD-10-CM | POA: Diagnosis not present

## 2014-05-11 DIAGNOSIS — I2510 Atherosclerotic heart disease of native coronary artery without angina pectoris: Secondary | ICD-10-CM | POA: Diagnosis not present

## 2014-05-11 DIAGNOSIS — E860 Dehydration: Secondary | ICD-10-CM | POA: Diagnosis not present

## 2014-05-11 DIAGNOSIS — Z950 Presence of cardiac pacemaker: Secondary | ICD-10-CM | POA: Diagnosis not present

## 2014-05-11 DIAGNOSIS — I4892 Unspecified atrial flutter: Secondary | ICD-10-CM | POA: Diagnosis not present

## 2014-05-11 DIAGNOSIS — I469 Cardiac arrest, cause unspecified: Secondary | ICD-10-CM | POA: Diagnosis not present

## 2014-05-11 DIAGNOSIS — M79604 Pain in right leg: Secondary | ICD-10-CM | POA: Clinically undetermined

## 2014-05-11 DIAGNOSIS — F0390 Unspecified dementia without behavioral disturbance: Secondary | ICD-10-CM | POA: Diagnosis not present

## 2014-05-11 DIAGNOSIS — G92 Toxic encephalopathy: Secondary | ICD-10-CM | POA: Diagnosis not present

## 2014-05-11 DIAGNOSIS — I4891 Unspecified atrial fibrillation: Secondary | ICD-10-CM | POA: Diagnosis not present

## 2014-05-11 DIAGNOSIS — I10 Essential (primary) hypertension: Secondary | ICD-10-CM

## 2014-05-11 DIAGNOSIS — Z833 Family history of diabetes mellitus: Secondary | ICD-10-CM | POA: Diagnosis not present

## 2014-05-11 DIAGNOSIS — E785 Hyperlipidemia, unspecified: Secondary | ICD-10-CM | POA: Diagnosis not present

## 2014-05-11 DIAGNOSIS — Z66 Do not resuscitate: Secondary | ICD-10-CM | POA: Diagnosis not present

## 2014-05-11 LAB — COMPREHENSIVE METABOLIC PANEL
ALBUMIN: 3 g/dL — AB (ref 3.5–5.2)
ALT: 22 U/L (ref 0–35)
AST: 54 U/L — AB (ref 0–37)
Alkaline Phosphatase: 55 U/L (ref 39–117)
Anion gap: 9 (ref 5–15)
BUN: 39 mg/dL — ABNORMAL HIGH (ref 6–23)
CO2: 19 mmol/L (ref 19–32)
CREATININE: 2.24 mg/dL — AB (ref 0.50–1.10)
Calcium: 8.3 mg/dL — ABNORMAL LOW (ref 8.4–10.5)
Chloride: 108 mEq/L (ref 96–112)
GFR calc Af Amer: 21 mL/min — ABNORMAL LOW (ref >90)
GFR, EST NON AFRICAN AMERICAN: 18 mL/min — AB (ref >90)
Glucose, Bld: 117 mg/dL — ABNORMAL HIGH (ref 70–99)
Potassium: 3.7 mmol/L (ref 3.5–5.1)
Sodium: 136 mmol/L (ref 135–145)
TOTAL PROTEIN: 5.4 g/dL — AB (ref 6.0–8.3)
Total Bilirubin: 2.9 mg/dL — ABNORMAL HIGH (ref 0.3–1.2)

## 2014-05-11 LAB — BRAIN NATRIURETIC PEPTIDE: B Natriuretic Peptide: 761 pg/mL — ABNORMAL HIGH (ref 0.0–100.0)

## 2014-05-11 LAB — TSH: TSH: 4.039 u[IU]/mL (ref 0.350–4.500)

## 2014-05-11 LAB — CBC
HEMATOCRIT: 36.6 % (ref 36.0–46.0)
Hemoglobin: 12.3 g/dL (ref 12.0–15.0)
MCH: 28.3 pg (ref 26.0–34.0)
MCHC: 33.6 g/dL (ref 30.0–36.0)
MCV: 84.3 fL (ref 78.0–100.0)
Platelets: 103 10*3/uL — ABNORMAL LOW (ref 150–400)
RBC: 4.34 MIL/uL (ref 3.87–5.11)
RDW: 17.2 % — ABNORMAL HIGH (ref 11.5–15.5)
WBC: 7.6 10*3/uL (ref 4.0–10.5)

## 2014-05-11 LAB — TROPONIN I
TROPONIN I: 0.07 ng/mL — AB (ref <0.031)
Troponin I: 0.07 ng/mL — ABNORMAL HIGH (ref <0.031)

## 2014-05-11 LAB — CK: CK TOTAL: 666 U/L — AB (ref 7–177)

## 2014-05-11 MED ORDER — METOPROLOL TARTRATE 1 MG/ML IV SOLN
2.5000 mg | Freq: Four times a day (QID) | INTRAVENOUS | Status: DC
  Filled 2014-05-11: qty 5

## 2014-05-11 MED ORDER — ACETAMINOPHEN 325 MG PO TABS
650.0000 mg | ORAL_TABLET | Freq: Four times a day (QID) | ORAL | Status: AC | PRN
  Administered 2014-05-11 (×2): 650 mg via ORAL
  Filled 2014-05-11 (×2): qty 2

## 2014-05-11 MED ORDER — METOPROLOL TARTRATE 25 MG PO TABS: 25.0000 mg | ORAL_TABLET | Freq: Two times a day (BID) | ORAL | Status: DC

## 2014-05-11 MED ORDER — OXYCODONE HCL 5 MG PO TABS
5.0000 mg | ORAL_TABLET | Freq: Four times a day (QID) | ORAL | Status: DC | PRN
  Administered 2014-05-11: 5 mg via ORAL
  Filled 2014-05-11 (×2): qty 1

## 2014-05-11 MED ORDER — METOPROLOL TARTRATE 25 MG PO TABS
25.0000 mg | ORAL_TABLET | Freq: Three times a day (TID) | ORAL | Status: DC
  Administered 2014-05-11 (×2): 25 mg via ORAL
  Filled 2014-05-11 (×2): qty 1

## 2014-05-11 MED ORDER — METOPROLOL TARTRATE 25 MG PO TABS
12.5000 mg | ORAL_TABLET | Freq: Two times a day (BID) | ORAL | Status: DC
  Administered 2014-05-11: 12.5 mg via ORAL
  Filled 2014-05-11: qty 1

## 2014-05-12 LAB — VITAMIN B12: Vitamin B-12: 1133 pg/mL — ABNORMAL HIGH (ref 211–911)

## 2014-05-12 LAB — HOMOCYSTEINE: Homocysteine: 21.3 umol/L — ABNORMAL HIGH (ref 4.0–15.4)

## 2014-05-12 NOTE — Plan of Care (Signed)
Problem: Phase II Progression Outcomes Goal: Discharge plan established Outcome: Completed/Met Date Met:  05/31/2014 Patient Passed away 05/30/14 at July 04, 2331

## 2014-05-12 NOTE — Care Management Utilization Note (Signed)
UR completed 

## 2014-05-15 LAB — CULTURE, BLOOD (ROUTINE X 2)
CULTURE: NO GROWTH
CULTURE: NO GROWTH

## 2014-06-08 NOTE — Care Management Utilization Note (Signed)
UR complete 

## 2014-06-08 NOTE — Consult Note (Signed)
CARDIOLOGY CONSULT NOTE   Patient ID: Brittany Hickman MRN: [id number] DOB/AGE: Jul 26, 1920 81 y.o.  Admit Date: 06/09/2014 Referring Physician: PTH-Gosrani  Primary Physician: Brittany Flavin, MD Consulting Cardiologist: Brittany Rich MD Primary Cardiologist: Brittany Bunting MD Reason for Consultation: Atrial flutter with RVR  Clinical Summary Brittany Hickman is a 81 y.o.female with known history of complete heart, make sure a pacemaker in situ, place December 2014, chronic atrial fibrillation, CAD with prior 5 vessel CABG in 1999, and hypertension, with recent admission in December 2014 for shortness of breath and bilateral pleural effusions with CHF. She was continued on amiodarone, started on labetalol, and continued on Eliquis.    She was admitted after being found on the floor, with bruises to her nose and her eyes, probable fall, but uncertain if this was syncope versus mechanical fall. The patient had a second fall . Daughter is recollection, and didn't consciousness at that time. He was also noted primarily that she was having trouble recognizing family members. On arrival to the emergency room she was found to be in atrial fib/flutter heart rate 120 bpm.    Initial vital signs on admission blood pressure 156/100, heart rate 129, respirations 28, she was afebrile. Urinalysis positive for UTI. There is no leukocytosis, she was not found to be anemic, initial troponin 0.06, CT scan of the head demonstrated mildly depressed nasal bone fracture, mild diffuse cortical atrophy, mild chronic ischemic white married disease, without acute intracranial abnormality seen. Chest x-ray revealed new left pleural effusion with associated airspace disease, asymmetric pulmonary vascular congestion on the left, with an element of CHF.    She was started on diltiazem drip after 10 mg bolus, she was started on antibiotics to include Zithromax, and Rocephin. We are asked for cardiology recommendations in the setting of  atrial flutter with RVR, neurology is also seeing her for syncopal episode. They have recommended that she continue Eliquis as it is safe from their standpoint.    She is very hard of hearing, difficult to communicate with her. Daughter at bedside provides majority of information and history.      No Known Allergies  Medications Scheduled Medications: . antiseptic oral rinse  7 mL Mouth Rinse BID  . apixaban  2.5 mg Oral BID  . azithromycin  500 mg Intravenous Q24H  . cefTRIAXone (ROCEPHIN)  IV  1 g Intravenous Q24H  . feeding supplement (ENSURE COMPLETE)  237 mL Oral BID BM  . hydrALAZINE  25 mg Oral 3 times per day  . Influenza vac split quadrivalent PF  0.5 mL Intramuscular Tomorrow-1000  . metoprolol tartrate  12.5 mg Oral BID  . raloxifene  60 mg Oral Daily  . sodium chloride  3 mL Intravenous Q12H  . sodium chloride  3 mL Intravenous Q12H    Infusions: . sodium chloride 50 mL/hr at 05/15/2014 0600  . diltiazem (CARDIZEM) infusion 5 mg/hr (06/09/14 1916)    PRN Medications: acetaminophen, nitroGLYCERIN, ondansetron **OR** ondansetron (ZOFRAN) IV, oxyCODONE   Past Medical History  Diagnosis Date  . CAD (coronary artery disease)     a. s/p 5 vessel CABG in 1999.  Marland Kitchen HTN (hypertension)   . Hyperlipidemia   . Osteoarthritis   . Osteoporosis   . Mobitz II     a. 04/2013: s/p MDT Sensia DC PPM model SEDR0 1 (ser # [id number]  . Atrial fibrillation     a. Dx 04/2013-->ibutilide cardioversion-->recurrent afib within hours-->eliquis initiated.    Past Surgical History  Procedure  Laterality Date  . Cholecystectomy    . Bladder suspension    . Rectal prolapse repair    . Pacemaker insertion  04-08-2013    MDT SEDR01 implanted by Dr Johney Frame for Mobitz II AV block  . Permanent pacemaker insertion N/A 04/08/2013    Procedure: PERMANENT PACEMAKER INSERTION;  Surgeon: Gardiner Rhyme, MD;  Location: [facility] CATH [facility];  Service: Cardiovascular;  Laterality: N/A;  . Coronary artery bypass  graft      Family History  Problem Relation Age of Onset  . Gastric cancer Father   . Stroke Mother   . Diabetes Sister   . Epilepsy    . Breast cancer      Social History Ms. Fantroy reports that she has never smoked. She has never used smokeless tobacco. Ms. Alcivar reports that she does not drink alcohol.  Review of Systems Complete review of systems are found to be negative unless outlined in H&P above.  Physical Examination Blood pressure 115/73, pulse 115, temperature 97.6 F (36.4 C), temperature source Axillary, resp. rate 21, height 5\' 4"  (1.626 m), weight 116 lb 6.5 oz (52.8 kg), SpO2 93 %.  Intake/Output Summary (Last 24 hours) at 09-Jun-2014 0958 Last data filed at 2014/06/09 0600  Gross per 24 hour  Intake 320.83 ml  Output    350 ml  Net -29.17 ml    Telemetry: Atrial flutter, heart rate is variable. Some pacing is also negative.  GEN: Restless, complaining of leg pain. HEENT: Conjunctiva and lids normal, oropharynx clear with moist mucosa. Neck: Supple, no elevated JVP or carotid bruits, no thyromegaly. Lungs: Clear to auscultation, nonlabored breathing at rest. Cardiac: Regular rate and rhythm, no S3 or significant systolic murmur, no pericardial rub. Abdomen: Soft, nontender, no hepatomegaly, bowel sounds present, no guarding or rebound. Extremities: No pitting edema, distal pulses 2+. Skin: Warm and dry. Musculoskeletal: No kyphosis. Neuropsychiatric: Alert and oriented x3, affect grossly appropriate.  Prior Cardiac Testing/Procedures 1. Pacemaker interrogation 08/15/2013 Pacemaker check in clinic. Normal device function. Thresholds, sensing, impedances consistent with previous measurements. Device programmed to maximize longevity. 4% mode switched, + eliquis. No high ventricular rates noted. Device programmed at  appropriate safety margins. Histogram distribution appropriate for patient activity level. Device programmed to optimize intrinsic conduction. Estimated  longevity 11 years. Patient enrolled in remote follow-up/TTM's with [employer]. Plan to follow every 3  months remotely and see annually in office. Patient education completed.   2. Echo 04/08/2013 Left ventricle: The cavity size was normal. There was mild focal basal hypertrophy of the septum. Systolic function was normal. The estimated ejection fraction was in the range of 60% to 65%. Wall motion was normal; there were no regional wall motion abnormalities. - Mitral valve: Calcified annulus. Mild regurgitation. - Left atrium: The atrium was moderately dilated. - Right atrium: The atrium was mildly dilated. - Tricuspid valve: Moderate regurgitation. - Pulmonary arteries: Systolic pressure was mildly increased. PA peak pressure: 53mm Hg (S).  Lab  Results:  Basic Metabolic Panel:  Recent Labs Lab 05/09/2014 1847 Jun 09, 2014 0308  NA 137 136  K 4.3 3.7  CL 106 108  CO2 18* 19  GLUCOSE 165* 117*  BUN 37* 39*  CREATININE 2.20* 2.24*  CALCIUM 9.4 8.3*  MG 1.7  --     Liver Function Tests:  Recent Labs Lab 06-09-14 0308  AST 54*  ALT 22  ALKPHOS 55  BILITOT 2.9*  PROT 5.4*  ALBUMIN 3.0*    CBC:  Recent Labs Lab 05/25/2014 1847  05/17/2014 0308  WBC 10.1 7.6  NEUTROABS 8.6*  --   HGB 15.2* 12.3  HCT 46.5* 36.6  MCV 84.7 84.3  PLT 122* 103*    Cardiac Enzymes:  Recent Labs Lab 05/12/14 1847 May 12, 2014 2129 05/14/2014 0308 06/07/2014 0840  CKTOTAL 932*  --  666*  --   TROPONINI 0.06* 0.06* 0.07* 0.07*    Radiology: Dg Chest 1 View  12-May-2014   CLINICAL DATA:  Fall hitting face and inner tendon centered. Found on the floor.  EXAM: CHEST - 1 VIEW  COMPARISON:  One-view chest 04/23/2013.  FINDINGS: The heart is enlarged. The patient is status post median sternotomy for CABG. Pacing wires are stable in position. A new left pleural effusion is present. Left basilar airspace disease is noted as well. The right lung is clear. Mild pulmonary vascular congestion is  noted. Scarring at the lung apices is stable.  IMPRESSION: 1. New left pleural effusion and associated airspace disease. This is most concerning for infection. 2. Asymmetric pulmonary vascular congestion on the left. An element of congestive heart failure is also considered. 3. The right lung is clear.   Electronically Signed   By: Gennette Pac M.D.   On: 05/12/14 19:19   Ct Head Wo Contrast  05/12/14   CLINICAL DATA:  Facial injury after multiple falls at home today.  EXAM: CT HEAD WITHOUT CONTRAST  CT MAXILLOFACIAL WITHOUT CONTRAST  CT CERVICAL SPINE WITHOUT CONTRAST  TECHNIQUE: Multidetector CT imaging of the head, cervical spine, and maxillofacial structures were performed using the standard protocol without intravenous contrast. Multiplanar CT image reconstructions of the cervical spine and maxillofacial structures were also generated.  COMPARISON:  CT scan of head of April 07, 2013.  FINDINGS: CT HEAD FINDINGS  Bony calvarium appears intact. Mild diffuse cortical atrophy is noted. Mild chronic ischemic white matter disease is noted. No mass effect or midline shift is noted. Ventricular size is within normal limits. There is no evidence of mass lesion, hemorrhage or acute infarction.  CT MAXILLOFACIAL FINDINGS  Mildly depressed nasal bone fracture is noted. Paranasal sinuses appear normal. Pterygoid plates appear normal. No other fracture is noted. Ostiomeatal complexes are widely patent. Globes and orbits appear normal.  CT CERVICAL SPINE FINDINGS  No fracture or spondylolisthesis is noted. Mild hypertrophy of posterior facet joints is noted. Degenerative disc disease is noted at C6-7 and C7-T1 with anterior osteophyte formation.  IMPRESSION: Mild diffuse cortical atrophy. Mild chronic ischemic white matter disease. No acute intracranial abnormality seen.  Mildly depressed nasal bone fracture is noted.  Degenerative changes as described above in the cervical spine. No acute fracture or  spondylolisthesis is noted in the cervical spine.   Electronically Signed   By: Roque Lias M.D.   On: 2014-05-12 19:42   Ct Cervical Spine Wo Contrast  05-12-2014   CLINICAL DATA:  Facial injury after multiple falls at home today.  EXAM: CT HEAD WITHOUT CONTRAST  CT MAXILLOFACIAL WITHOUT CONTRAST  CT CERVICAL SPINE WITHOUT CONTRAST  TECHNIQUE: Multidetector CT imaging of the head, cervical spine, and maxillofacial structures were performed using the standard protocol without intravenous contrast. Multiplanar CT image reconstructions of the cervical spine and maxillofacial structures were also generated.  COMPARISON:  CT scan of head of April 07, 2013.  FINDINGS: CT HEAD FINDINGS  Bony calvarium appears intact. Mild diffuse cortical atrophy is noted. Mild chronic ischemic white matter disease is noted. No mass effect or midline shift is noted. Ventricular size is within normal limits. There  is no evidence of mass lesion, hemorrhage or acute infarction.  CT MAXILLOFACIAL FINDINGS  Mildly depressed nasal bone fracture is noted. Paranasal sinuses appear normal. Pterygoid plates appear normal. No other fracture is noted. Ostiomeatal complexes are widely patent. Globes and orbits appear normal.  CT CERVICAL SPINE FINDINGS  No fracture or spondylolisthesis is noted. Mild hypertrophy of posterior facet joints is noted. Degenerative disc disease is noted at C6-7 and C7-T1 with anterior osteophyte formation.  IMPRESSION: Mild diffuse cortical atrophy. Mild chronic ischemic white matter disease. No acute intracranial abnormality seen.  Mildly depressed nasal bone fracture is noted.  Degenerative changes as described above in the cervical spine. No acute fracture or spondylolisthesis is noted in the cervical spine.   Electronically Signed   By: Roque Lias M.D.   On: May 19, 2014 19:42   Ct Maxillofacial Wo Cm  May 19, 2014   CLINICAL DATA:  Facial injury after multiple falls at home today.  EXAM: CT HEAD WITHOUT CONTRAST   CT MAXILLOFACIAL WITHOUT CONTRAST  CT CERVICAL SPINE WITHOUT CONTRAST  TECHNIQUE: Multidetector CT imaging of the head, cervical spine, and maxillofacial structures were performed using the standard protocol without intravenous contrast. Multiplanar CT image reconstructions of the cervical spine and maxillofacial structures were also generated.  COMPARISON:  CT scan of head of April 07, 2013.  FINDINGS: CT HEAD FINDINGS  Bony calvarium appears intact. Mild diffuse cortical atrophy is noted. Mild chronic ischemic white matter disease is noted. No mass effect or midline shift is noted. Ventricular size is within normal limits. There is no evidence of mass lesion, hemorrhage or acute infarction.  CT MAXILLOFACIAL FINDINGS  Mildly depressed nasal bone fracture is noted. Paranasal sinuses appear normal. Pterygoid plates appear normal. No other fracture is noted. Ostiomeatal complexes are widely patent. Globes and orbits appear normal.  CT CERVICAL SPINE FINDINGS  No fracture or spondylolisthesis is noted. Mild hypertrophy of posterior facet joints is noted. Degenerative disc disease is noted at C6-7 and C7-T1 with anterior osteophyte formation.  IMPRESSION: Mild diffuse cortical atrophy. Mild chronic ischemic white matter disease. No acute intracranial abnormality seen.  Mildly depressed nasal bone fracture is noted.  Degenerative changes as described above in the cervical spine. No acute fracture or spondylolisthesis is noted in the cervical spine.   Electronically Signed   By: Roque Lias M.D.   On: May 19, 2014 19:42     ZOX:WRUEAV flutter with rate of 129 bpm.    Impression and Recommendations  1.Atrial flutter: Review of notes from Dr. Ladona Ridgel states that she failed Tikosyn therapy. She is now on IV diltiazem and low dose metoprolol 12.5 mg BID. I will increase the metoprolol to 50 mg BID, d/c IV diltiazem and change to po diltiazem 30 mg Q 6 hours. Will recheck echo for changes in LV fx. She has been seen  by neurology and is safe to continue Eliquis from their standpoint. Slightly elevated troponin, demand ischemia. No plans for ischemic testing at this time.   2. [employer] PPM in situ: She was last interrogated in April of 2015. Will have this interrogated during this admission.   3. Syncopal Episode: Related to infective process, with pneumonia and UTI. Continue current abx therapy.   4. CAD:  Known history of CAD with CABG. Continue BB with higher dose as recommended above. She is not on a statin currently, no allergies to same.   5.CHF: Elevated HR with Atrial flutter may have contributed. She is currently not on diuretic. No overt evidence of fluid  overload on exam. HR control will be priority.   5. Hypokalemia: Complaining of leg pain and cramping. Potassium is 3.7 this am. Will give extra doses to keep potassium at 4.0 with known CAD..     SignedBettey Mare. Lawrence NP [facility]  06/05/2014, 9:58 AM Co-Sign MD  Patient seen and discussed with NP Lawerence, I agree with her documentation above. 81 yo female hx of CAD s/p prior CABG, complete heart blocker with pacemaker, HTN, afib admitted with  From notes she previously failed tikasyn, has been on rate control strategy with labetalol, on eliuqis for stroke prevention. She is admitted after a fall at home. Patient was found down, details surrounding the fall are unknown. In ER noted to have infiltrate on CXR and is being treated for pneumonia. Found to be in afib with RVR, started on dilt gtt.   04/2013 Echo LVEF 60-65%, mild MR, mod TR, PASP 53, diastolic function not described.  CXR left pleural effusion with probable pneumonia, asymmetric pulm congestion EKG aflutter with V-pacing CT head no bleed TSH 2.99, Mg 1.7, K 4.3, Cr 2.2, GFR 18, WBC 10, Hgb 15.2, Plt 122, BNP 761, trop 0.07 Device interrogation 05/11/13: normal device function. Multiple episodes of afib/aflutter with high rates.   Reason for fall likely multifactorial.  Possible that pneumonia set off her aflutter causing multiple episodes of elevated rates contributing to her fall. From prior notes she has failed [place]. Will focus on rate control strategy at this time, with her pacemaker and stable bp we have room to be titrate AV nodal agents. Will stop hydralazine to give more room with bp for this titration. Will start lopressor  tid with hold parameters. Given this fall was likely due to pneumonia and aflutter with elevated rates, would continue eliquis at this time due to high stroke risk. Her troponin is just slightly above detection level, likely demand ischemia due to infection and high heart rates. F/u echo, work on rate control.    Dominga Ferry MD

## 2014-06-08 NOTE — Consult Note (Signed)
Brittany A. Merlene Laughter, MD     [email or web address]          Brittany Hickman is an 81 y.o. female.   ASSESSMENT/PLAN: 1. Multifactorial toxic metabolic encephalopathy including pneumonia, UTI, dehydration and rhabdomyolysis. I suspect that the patient should return to baseline once her acute medical illnesses are resolved. There may be a lag however.  2. Mild cognitive impairment at baseline with increased risk of dementia given age and increased atrophy on imaging. Dementia labs will be obtained.   3. Fall which I suspect is likely due to the patient's acute medical illness. There is no baseline history to suggest ongoing gait impairment.  4. Atrial fibrillation on chronic anticoagulation for secondary stroke prevention. I would suggest that we continue this as there is no history of ongoing gait impairment.  The patient 81 year old white female who lives by herself. Her daughters are nearby. She lives in what appears to be an independent living facility apartment. The patient was found on the floor by her daughter. She was not unresponsive but apparently she has fell and could not get up. The patient initially could not recall how she got the floor. Over the course of the day however it appears that she related to them that she may of fell. It appears that she was intermittently confused throughout the day. The family reported that she fell again that day. She was subsequently taken to the emergency room for further evaluation. The daughter reports that she has not been falling at all. She typically ambulates decently. Again, she was by herself. She is relatively cognitively intact although the daughter reports that she has had some short-term memory impairment recently. No focal neurological deficits are reported although the patient complains of knee pain especially since falling. She also reports chronic right hip pain. Review of systems is limited due to the confusion and  marked hearing impairment.  GENERAL: This a very pleasant thin female in no acute distress.  HEENT: Supple. She has significant bruising involving the nose and frontal region of the head. She has severe hearing impairment bilaterally.  ABDOMEN: soft  EXTREMITIES: No edema. Mild bruising left knee.   BACK: Normal.  SKIN: Normal by inspection.    MENTAL STATUS: She is jovial. She is awake and alert. She does follow commands with much prompting. She does seem to be intermittently confused having nonsensical speech at times. Language seems intact.  CRANIAL NERVES: Pupils are equal, round and reactive to light and accommodation; extra ocular movements are full, there is no significant nystagmus; visual fields are full; upper and lower facial muscles are normal in strength and symmetric, there is no flattening of the nasolabial folds; tongue is midline; uvula is midline; shoulder elevation is normal.  MOTOR: At least 4/5 strength in the upper extremity and left lower extremity. Slightly weaker right lower extremity on hip flexion 4 minus/5 due to pain.  COORDINATION: Left finger to nose is normal, right finger to nose is normal, No rest tremor; no intention tremor; no postural tremor; no bradykinesia.  REFLEXES: Deep tendon reflexes are symmetrical and normal.   SENSATION: Responsive bilaterally to pain. Marland Kitchen  Head CT scan is reviewed in person and shows moderate global atrophy. Nothing acute is seen.   Blood pressure 115/73, pulse 115, temperature 97.6 F (36.4 C), temperature source Axillary, resp. rate 21, height 5' 4"  (1.626 m), weight 52.8 kg (116 lb 6.5 oz), SpO2 93 %.  Past Medical History  Diagnosis Date  . CAD (  coronary artery disease)     a. s/p 5 vessel CABG in 1999.  Marland Kitchen HTN (hypertension)   . Hyperlipidemia   . Osteoarthritis   . Osteoporosis   . Mobitz II     a. 04/2013: s/p MDT Sensia DC PPM model SEDR0 1 (ser # [id number] H  . Atrial fibrillation     a. Dx  04/2013-->ibutilide cardioversion-->recurrent afib within hours-->eliquis initiated.    Past Surgical History  Procedure Laterality Date  . Cholecystectomy    . Bladder suspension    . Rectal prolapse repair    . Pacemaker insertion  04-08-2013    MDT [id number] implanted by Dr Rayann Heman for Mobitz II AV block  . Permanent pacemaker insertion N/A 04/08/2013    Procedure: PERMANENT PACEMAKER INSERTION;  Surgeon: Coralyn Mark, MD;  Location: [facility];  Service: Cardiovascular;  Laterality: N/A;  . Coronary artery bypass graft      Family History  Problem Relation Age of Onset  . Gastric cancer Father   . Stroke Mother   . Diabetes Sister   . Epilepsy    . Breast cancer      Social History:  reports that she has never smoked. She has never used smokeless tobacco. She reports that she does not drink alcohol or use illicit drugs.  Allergies: No Known Allergies  Medications: Prior to Admission medications   Medication Sig Start Date End Date Taking? Authorizing Provider  CALCIUM PO Take 1 tablet by mouth daily.   Yes Historical Provider, MD  ELIQUIS 2.5 MG TABS tablet take 1 tablet twice a day 12/18/13  Yes Evans Lance, MD  furosemide (LASIX) 40 MG tablet Take 40 mg by mouth as needed (for swelling 3lbs gain in one day or 5lbs in 5 days).   Yes Historical Provider, MD  hydrALAZINE (APRESOLINE) 25 MG tablet Take 1 tablet (25 mg total) by mouth every 8 (eight) hours. 04/26/13  Yes Thurnell Lose, MD  labetalol (NORMODYNE) 100 MG tablet Take 1 tablet (100 mg total) by mouth 2 (two) times daily. 07/02/13  Yes Imogene Burn, PA-C  Multiple Vitamins-Minerals (CENTRUM SILVER ADULT 50+) TABS Take by mouth daily.   Yes Historical Provider, MD  nitroGLYCERIN (NITROSTAT) 0.4 MG SL tablet Place 1 tablet (0.4 mg total) under the tongue every 5 (five) minutes x 3 doses as needed for chest pain. 04/10/13  Yes Rogelia Mire, NP  potassium chloride (K-DUR) 10 MEQ tablet Take 10 mEq by mouth as  needed (WITH LASIX).   Yes Historical Provider, MD  raloxifene (EVISTA) 60 MG tablet Take 60 mg by mouth daily.   Yes Historical Provider, MD    Scheduled Meds: . antiseptic oral rinse  7 mL Mouth Rinse BID  . apixaban  2.5 mg Oral BID  . azithromycin  500 mg Intravenous Q24H  . cefTRIAXone (ROCEPHIN)  IV  1 g Intravenous Q24H  . feeding supplement (ENSURE COMPLETE)  237 mL Oral BID BM  . hydrALAZINE  25 mg Oral 3 times per day  . Influenza vac split quadrivalent PF  0.5 mL Intramuscular Tomorrow-1000  . labetalol  100 mg Oral BID  . raloxifene  60 mg Oral Daily  . sodium chloride  3 mL Intravenous Q12H  . sodium chloride  3 mL Intravenous Q12H   Continuous Infusions: . sodium chloride 50 mL/hr at 2014/06/04 0600  . diltiazem (CARDIZEM) infusion 5 mg/hr (05/09/2014 1916)   PRN Meds:.acetaminophen, nitroGLYCERIN, ondansetron **OR** ondansetron (ZOFRAN) IV  Results for orders placed or performed during the hospital encounter of 05/26/2014 (from the past 48 hour(s))  CBC with Differential     Status: Abnormal   Collection Time: 05/14/2014  6:47 PM  Result Value Ref Range   WBC 10.1 4.0 - 10.5 K/uL   RBC 5.49 (H) 3.87 - 5.11 MIL/uL   Hemoglobin 15.2 (H) 12.0 - 15.0 g/dL   HCT 46.5 (H) 36.0 - 46.0 %   MCV 84.7 78.0 - 100.0 fL   MCH 27.7 26.0 - 34.0 pg   MCHC 32.7 30.0 - 36.0 g/dL   RDW 17.2 (H) 11.5 - 15.5 %   Platelets 122 (L) 150 - 400 K/uL   Neutrophils Relative % 84 (H) 43 - 77 %   Neutro Abs 8.6 (H) 1.7 - 7.7 K/uL   Lymphocytes Relative 10 (L) 12 - 46 %   Lymphs Abs 1.0 0.7 - 4.0 K/uL   Monocytes Relative 6 3 - 12 %   Monocytes Absolute 0.6 0.1 - 1.0 K/uL   Eosinophils Relative 0 0 - 5 %   Eosinophils Absolute 0.0 0.0 - 0.7 K/uL   Basophils Relative 0 0 - 1 %   Basophils Absolute 0.0 0.0 - 0.1 K/uL  Basic metabolic panel     Status: Abnormal   Collection Time: 05/23/2014  6:47 PM  Result Value Ref Range   Sodium 137 135 - 145 mmol/L    Comment: DELTA CHECK NOTED Please  note change in reference range.    Potassium 4.3 3.5 - 5.1 mmol/L    Comment: Please note change in reference range.   Chloride 106 96 - 112 mEq/L   CO2 18 (L) 19 - 32 mmol/L   Glucose, Bld 165 (H) 70 - 99 mg/dL   BUN 37 (H) 6 - 23 mg/dL   Creatinine, Ser 2.20 (H) 0.50 - 1.10 mg/dL   Calcium 9.4 8.4 - 10.5 mg/dL   GFR calc non Af Amer 18 (L) >90 mL/min   GFR calc Af Amer 21 (L) >90 mL/min    Comment: (NOTE) The eGFR has been calculated using the CKD EPI equation. This calculation has not been validated in all clinical situations. eGFR's persistently <90 mL/min signify possible Chronic Kidney Disease.    Anion gap 13 5 - 15  Magnesium     Status: None   Collection Time: 05/16/2014  6:47 PM  Result Value Ref Range   Magnesium 1.7 1.5 - 2.5 mg/dL  TSH     Status: None   Collection Time: 05/23/2014  6:47 PM  Result Value Ref Range   TSH 4.427 0.350 - 4.500 uIU/mL  Troponin I     Status: Abnormal   Collection Time: 05/22/2014  6:47 PM  Result Value Ref Range   Troponin I 0.06 (H) <0.031 ng/mL    Comment:        PERSISTENTLY INCREASED TROPONIN VALUES IN THE RANGE OF 0.04-0.49 ng/mL CAN BE SEEN IN:       -UNSTABLE ANGINA       -CONGESTIVE HEART FAILURE       -MYOCARDITIS       -CHEST TRAUMA       -ARRYHTHMIAS       -LATE PRESENTING MYOCARDIAL INFARCTION       -COPD   CLINICAL FOLLOW-UP RECOMMENDED. Please note change in reference range.   APTT     Status: Abnormal   Collection Time: 06/02/2014  6:47 PM  Result Value Ref Range   aPTT 39 (H)  24 - 37 seconds    Comment:        IF BASELINE aPTT IS ELEVATED, SUGGEST PATIENT RISK ASSESSMENT BE USED TO DETERMINE APPROPRIATE ANTICOAGULANT THERAPY.   Protime-INR     Status: Abnormal   Collection Time: 06/06/2014  6:47 PM  Result Value Ref Range   Prothrombin Time 20.2 (H) 11.6 - 15.2 seconds   INR 1.71 (H) 0.00 - 1.49  CK     Status: Abnormal   Collection Time: 06/03/2014  6:47 PM  Result Value Ref Range   Total CK 932 (H) 7 - 177  U/L  Blood culture (routine x 2)     Status: None (Preliminary result)   Collection Time: 05/28/2014  8:07 PM  Result Value Ref Range   Specimen Description LEFT ANTECUBITAL    Special Requests BOTTLES DRAWN AEROBIC AND ANAEROBIC 6CC    Culture PENDING    Report Status PENDING   Blood culture (routine x 2)     Status: None (Preliminary result)   Collection Time: 05/28/2014  8:08 PM  Result Value Ref Range   Specimen Description BLOOD LEFT ARM    Special Requests BOTTLES DRAWN AEROBIC AND ANAEROBIC 6CC    Culture PENDING    Report Status PENDING   MRSA PCR Screening     Status: None   Collection Time: 05/24/2014  8:50 PM  Result Value Ref Range   MRSA by PCR NEGATIVE NEGATIVE    Comment:        The GeneXpert MRSA Assay (FDA approved for NASAL specimens only), is one component of a comprehensive MRSA colonization surveillance program. It is not intended to diagnose MRSA infection nor to guide or monitor treatment for MRSA infections.   Urinalysis, Routine w reflex microscopic     Status: Abnormal   Collection Time: 05/15/2014  9:29 PM  Result Value Ref Range   Color, Urine YELLOW YELLOW   APPearance HAZY (A) CLEAR   Specific Gravity, Urine >1.030 (H) 1.005 - 1.030   pH 5.0 5.0 - 8.0   Glucose, UA NEGATIVE NEGATIVE mg/dL   Hgb urine dipstick TRACE (A) NEGATIVE   Bilirubin Urine SMALL (A) NEGATIVE   Ketones, ur NEGATIVE NEGATIVE mg/dL   Protein, ur 100 (A) NEGATIVE mg/dL   Urobilinogen, UA 1.0 0.0 - 1.0 mg/dL   Nitrite POSITIVE (A) NEGATIVE   Leukocytes, UA SMALL (A) NEGATIVE  Troponin I     Status: Abnormal   Collection Time: 05/21/2014  9:29 PM  Result Value Ref Range   Troponin I 0.06 (H) <0.031 ng/mL    Comment:        PERSISTENTLY INCREASED TROPONIN VALUES IN THE RANGE OF 0.04-0.49 ng/mL CAN BE SEEN IN:       -UNSTABLE ANGINA       -CONGESTIVE HEART FAILURE       -MYOCARDITIS       -CHEST TRAUMA       -ARRYHTHMIAS       -LATE PRESENTING MYOCARDIAL INFARCTION        -COPD   CLINICAL FOLLOW-UP RECOMMENDED. Please note change in reference range.   TSH     Status: None   Collection Time: 05/09/2014  9:29 PM  Result Value Ref Range   TSH 2.999 0.350 - 4.500 uIU/mL  Urine microscopic-add on     Status: Abnormal   Collection Time: 05/08/2014  9:29 PM  Result Value Ref Range   WBC, UA 3-6 <3 WBC/hpf   RBC / HPF 3-6 <3 RBC/hpf  Bacteria, UA MANY (A) RARE  Troponin I     Status: Abnormal   Collection Time: 05-13-2014  3:08 AM  Result Value Ref Range   Troponin I 0.07 (H) <0.031 ng/mL    Comment:        PERSISTENTLY INCREASED TROPONIN VALUES IN THE RANGE OF 0.04-0.49 ng/mL CAN BE SEEN IN:       -UNSTABLE ANGINA       -CONGESTIVE HEART FAILURE       -MYOCARDITIS       -CHEST TRAUMA       -ARRYHTHMIAS       -LATE PRESENTING MYOCARDIAL INFARCTION       -COPD   CLINICAL FOLLOW-UP RECOMMENDED. Please note change in reference range.   Brain natriuretic peptide     Status: Abnormal   Collection Time: 05/13/2014  3:08 AM  Result Value Ref Range   B Natriuretic Peptide 761.0 (H) 0.0 - 100.0 pg/mL    Comment: Please note change in reference range.  Comprehensive metabolic panel     Status: Abnormal   Collection Time: 05-13-14  3:08 AM  Result Value Ref Range   Sodium 136 135 - 145 mmol/L    Comment: Please note change in reference range.   Potassium 3.7 3.5 - 5.1 mmol/L    Comment: Please note change in reference range.   Chloride 108 96 - 112 mEq/L   CO2 19 19 - 32 mmol/L   Glucose, Bld 117 (H) 70 - 99 mg/dL   BUN 39 (H) 6 - 23 mg/dL   Creatinine, Ser 2.24 (H) 0.50 - 1.10 mg/dL   Calcium 8.3 (L) 8.4 - 10.5 mg/dL   Total Protein 5.4 (L) 6.0 - 8.3 g/dL   Albumin 3.0 (L) 3.5 - 5.2 g/dL   AST 54 (H) 0 - 37 U/L   ALT 22 0 - 35 U/L   Alkaline Phosphatase 55 39 - 117 U/L   Total Bilirubin 2.9 (H) 0.3 - 1.2 mg/dL   GFR calc non Af Amer 18 (L) >90 mL/min   GFR calc Af Amer 21 (L) >90 mL/min    Comment: (NOTE) The eGFR has been calculated using the CKD  EPI equation. This calculation has not been validated in all clinical situations. eGFR's persistently <90 mL/min signify possible Chronic Kidney Disease.    Anion gap 9 5 - 15  CBC     Status: Abnormal   Collection Time: May 13, 2014  3:08 AM  Result Value Ref Range   WBC 7.6 4.0 - 10.5 K/uL   RBC 4.34 3.87 - 5.11 MIL/uL   Hemoglobin 12.3 12.0 - 15.0 g/dL    Comment: DELTA CHECK NOTED   HCT 36.6 36.0 - 46.0 %   MCV 84.3 78.0 - 100.0 fL   MCH 28.3 26.0 - 34.0 pg   MCHC 33.6 30.0 - 36.0 g/dL   RDW 17.2 (H) 11.5 - 15.5 %   Platelets 103 (L) 150 - 400 K/uL    Comment: SPECIMEN CHECKED FOR CLOTS  CK     Status: Abnormal   Collection Time: 05/13/2014  3:08 AM  Result Value Ref Range   Total CK 666 (H) 7 - 177 U/L    Studies/Results:     Corey Caulfield A. Merlene Hickman, M.D.  Diplomate, Tax adviser of Psychiatry and Neurology ( Neurology). 05/13/14, 8:31 AM

## 2014-06-08 NOTE — Progress Notes (Signed)
Subjective:  XCOVER Per RN staff, altered mental status,  Pt is clutching at chest with bilateral hands,  Pt's eyes pinpoint,  Pt not responsive.  Pt agonal.  Family has been notified of impending death  Objective: Vital signs in last 24 hours: Temp:  [97.3 F (36.3 C)-98.5 F (36.9 C)] 98.5 F (36.9 C) (01/04 2000) Pulse Rate:  [25-121] 55 (01/04 2315) Resp:  [0-24] 8 (01/04 2331) BP: (59-215)/(34-195) 116/91 mmHg (01/04 2315) SpO2:  [66 %-100 %] 84 % (01/04 2315) FiO2 (%):  [28 %] 28 % (01/04 1000) Weight:  [52.8 kg (116 lb 6.5 oz)] 52.8 kg (116 lb 6.5 oz) (01/05 0000) Weight change: 0.636 kg (1 lb 6.5 oz) Last BM Date: 25-May-2014  Intake/Output from previous day: 01/04 0701 - 01/05 0700 In: 1120 [P.O.:420; I.V.:700] Out: 200 [Urine:200] Intake/Output this shift: Total I/O In: 500 [I.V.:500] Out: 150 [Urine:150]  Heent: pupils 1mm symmetric, Neck: no jvd Heart: bradycardic s1, s2,  Lung: crackles at bases, no wheeze,  Abd: obese Ext: no c/c/e Neuro:  Pt not responsive to name Reflexes 2+ symmetric, with equivocal toes bil Pt able to move bil upper ext, and feet  Lab Results:  Recent Labs  06/06/2014 1847 05/25/2014 0308  WBC 10.1 7.6  HGB 15.2* 12.3  HCT 46.5* 36.6  PLT 122* 103*   BMET  Recent Labs  05/27/2014 1847 2014/05/25 0308  NA 137 136  K 4.3 3.7  CL 106 108  CO2 18* 19  GLUCOSE 165* 117*  BUN 37* 39*  CREATININE 2.20* 2.24*  CALCIUM 9.4 8.3*    Studies/Results: Dg Chest 1 View  05/23/2014   CLINICAL DATA:  Fall hitting face and inner tendon centered. Found on the floor.  EXAM: CHEST - 1 VIEW  COMPARISON:  One-view chest 04/23/2013.  FINDINGS: The heart is enlarged. The patient is status post median sternotomy for CABG. Pacing wires are stable in position. A new left pleural effusion is present. Left basilar airspace disease is noted as well. The right lung is clear. Mild pulmonary vascular congestion is noted. Scarring at the lung apices is stable.   IMPRESSION: 1. New left pleural effusion and associated airspace disease. This is most concerning for infection. 2. Asymmetric pulmonary vascular congestion on the left. An element of congestive heart failure is also considered. 3. The right lung is clear.   Electronically Signed   By: Gennette Pac M.D.   On: 06/07/2014 19:19   Ct Head Wo Contrast  05/29/2014   CLINICAL DATA:  Facial injury after multiple falls at home today.  EXAM: CT HEAD WITHOUT CONTRAST  CT MAXILLOFACIAL WITHOUT CONTRAST  CT CERVICAL SPINE WITHOUT CONTRAST  TECHNIQUE: Multidetector CT imaging of the head, cervical spine, and maxillofacial structures were performed using the standard protocol without intravenous contrast. Multiplanar CT image reconstructions of the cervical spine and maxillofacial structures were also generated.  COMPARISON:  CT scan of head of April 07, 2013.  FINDINGS: CT HEAD FINDINGS  Bony calvarium appears intact. Mild diffuse cortical atrophy is noted. Mild chronic ischemic white matter disease is noted. No mass effect or midline shift is noted. Ventricular size is within normal limits. There is no evidence of mass lesion, hemorrhage or acute infarction.  CT MAXILLOFACIAL FINDINGS  Mildly depressed nasal bone fracture is noted. Paranasal sinuses appear normal. Pterygoid plates appear normal. No other fracture is noted. Ostiomeatal complexes are widely patent. Globes and orbits appear normal.  CT CERVICAL SPINE FINDINGS  No fracture or spondylolisthesis is  noted. Mild hypertrophy of posterior facet joints is noted. Degenerative disc disease is noted at C6-7 and C7-T1 with anterior osteophyte formation.  IMPRESSION: Mild diffuse cortical atrophy. Mild chronic ischemic white matter disease. No acute intracranial abnormality seen.  Mildly depressed nasal bone fracture is noted.  Degenerative changes as described above in the cervical spine. No acute fracture or spondylolisthesis is noted in the cervical spine.    Electronically Signed   By: Roque Lias M.D.   On: 06/07/2014 19:42   Ct Cervical Spine Wo Contrast  06/01/2014   CLINICAL DATA:  Facial injury after multiple falls at home today.  EXAM: CT HEAD WITHOUT CONTRAST  CT MAXILLOFACIAL WITHOUT CONTRAST  CT CERVICAL SPINE WITHOUT CONTRAST  TECHNIQUE: Multidetector CT imaging of the head, cervical spine, and maxillofacial structures were performed using the standard protocol without intravenous contrast. Multiplanar CT image reconstructions of the cervical spine and maxillofacial structures were also generated.  COMPARISON:  CT scan of head of April 07, 2013.  FINDINGS: CT HEAD FINDINGS  Bony calvarium appears intact. Mild diffuse cortical atrophy is noted. Mild chronic ischemic white matter disease is noted. No mass effect or midline shift is noted. Ventricular size is within normal limits. There is no evidence of mass lesion, hemorrhage or acute infarction.  CT MAXILLOFACIAL FINDINGS  Mildly depressed nasal bone fracture is noted. Paranasal sinuses appear normal. Pterygoid plates appear normal. No other fracture is noted. Ostiomeatal complexes are widely patent. Globes and orbits appear normal.  CT CERVICAL SPINE FINDINGS  No fracture or spondylolisthesis is noted. Mild hypertrophy of posterior facet joints is noted. Degenerative disc disease is noted at C6-7 and C7-T1 with anterior osteophyte formation.  IMPRESSION: Mild diffuse cortical atrophy. Mild chronic ischemic white matter disease. No acute intracranial abnormality seen.  Mildly depressed nasal bone fracture is noted.  Degenerative changes as described above in the cervical spine. No acute fracture or spondylolisthesis is noted in the cervical spine.   Electronically Signed   By: Roque Lias M.D.   On: 05/31/2014 19:42   Dg Knee Complete 4 Views Right  May 21, 2014   CLINICAL DATA:  81 year old female who fell yesterday with pain and bruising at the lateral knee. Initial encounter.  EXAM: RIGHT KNEE -  COMPLETE 4+ VIEW  COMPARISON:  None.  FINDINGS: Medial surgical clips such as from previous saphenous vein harvest. Extensive calcified atherosclerosis in the right lower extremity. No definite joint effusion. Patella intact. Joint spaces and alignment are within normal limits for age. Fabella incidentally noted. No acute fracture identified.  IMPRESSION: No acute fracture or dislocation identified about the right knee.   Electronically Signed   By: Augusto Gamble M.D.   On: May 21, 2014 14:09   Ct Maxillofacial Wo Cm  06/01/2014   CLINICAL DATA:  Facial injury after multiple falls at home today.  EXAM: CT HEAD WITHOUT CONTRAST  CT MAXILLOFACIAL WITHOUT CONTRAST  CT CERVICAL SPINE WITHOUT CONTRAST  TECHNIQUE: Multidetector CT imaging of the head, cervical spine, and maxillofacial structures were performed using the standard protocol without intravenous contrast. Multiplanar CT image reconstructions of the cervical spine and maxillofacial structures were also generated.  COMPARISON:  CT scan of head of April 07, 2013.  FINDINGS: CT HEAD FINDINGS  Bony calvarium appears intact. Mild diffuse cortical atrophy is noted. Mild chronic ischemic white matter disease is noted. No mass effect or midline shift is noted. Ventricular size is within normal limits. There is no evidence of mass lesion, hemorrhage or acute infarction.  CT  MAXILLOFACIAL FINDINGS  Mildly depressed nasal bone fracture is noted. Paranasal sinuses appear normal. Pterygoid plates appear normal. No other fracture is noted. Ostiomeatal complexes are widely patent. Globes and orbits appear normal.  CT CERVICAL SPINE FINDINGS  No fracture or spondylolisthesis is noted. Mild hypertrophy of posterior facet joints is noted. Degenerative disc disease is noted at C6-7 and C7-T1 with anterior osteophyte formation.  IMPRESSION: Mild diffuse cortical atrophy. Mild chronic ischemic white matter disease. No acute intracranial abnormality seen.  Mildly depressed nasal bone  fracture is noted.  Degenerative changes as described above in the cervical spine. No acute fracture or spondylolisthesis is noted in the cervical spine.   Electronically Signed   By: Roque LiasJames  Green M.D.   On: 07-29-2014 19:42    Medications: I have reviewed the patient's current medications.  Assessment/Plan: Arrythmia/Cardiac arrest  (Code status DNR) AMS  Trop ordered , and CT brain ordered however before could be completed pt went into cardiac arrest.  Apparently due to fall prior to admission?, case has been referred to Medical examiner,  Death certificate filled out to the best of my ability.    LOS: 2 days   Pearson GrippeKIM, Sennie Borden 05/12/2014, 2:28 AM

## 2014-06-08 NOTE — Progress Notes (Signed)
Dr. Selena Batten called to floor after patient change in status, unable to take po metoprolol and elequis, unable to sip from straw.  Called mid level Schorr MD for IV meds.  Called daughter Velna Hatchet to tell her of Mom's change in status.  Will be monitoring her neuro.  Patient grabbed my head and kissed my cheek as she had previously during her care.  Seemed drowsy/sleepy.  Upon entering room with her HS meds, patient was witnessed clutching her chest and grimacing as if a massive MI may be occurring.  EKG attained.  Order for troponin and CT of head.  Viewed patient breathing status go to an agonal one.  Family notified of negative changes and they should come over.  Patient passed at 2333, peacefully.  [facility] notified, referral # [id number]. Barbette Or.

## 2014-06-08 NOTE — Progress Notes (Signed)
PROGRESS NOTE  Brittany Hickman ION:[id number] DOB: June 26, 1920 DOA: 06/01/2014 PCP: Selinda Flavin, MD  HPI/Recap of past 41 hours: 81 year old female who lives alone with past medical history of atrial fibrillation and chronic kidney disease who was brought in on 1/3 after her daughter found her on the floor sitting upright with bruising to her nose and eyes. The patient was unable to relay what happened and she apparently fallen, but it was unclear when that happened. Initially the daughter stayed with the patient and patient seemed to improve, but then had a syncopal episode and fell again. She was then brought into the emergency room where it was noted that she was in rapid atrial fibrillation, somewhat hypothermic, mild acute renal failure and had a left sided pneumonia. Hospitalists were called for further evaluation and admission  Assessment/Plan: Principal Problem:   Altered mental status: Felt to be multifactorial from pneumonia, possible concussion, hypothermia and atrial fibrillation. CT scan of brain unrevealing and will plan to recheck once more as patient has pacemaker and cannot get MRI. Active Problems:   HTN (hypertension): Monitor blood pressures with rate control   Atrial fibrillation: Initially attempted on Cardizem drip which led to bradycardia. Patient placed in stepdown unit for this. Cardiology following who recommended continuing anticoagulation given high risk for CVA and working on rate control with oral medications.    Acute on CKD (chronic kidney disease), stage III: With hydration, improving   Fall: Likely secondary to hypoxia, dehydration and atrial fibrillation. One more stable, physical therapy   CAP (community acquired pneumonia)   Hypothermia: Likely secondary to infection. TSH normal.    Nasal fracture: Secondary to fall, stable   Non-traumatic rhabdomyolysis: Likely secondary to fall, CPKs trending downward   Atrial flutter, unspecified   Elevated  troponin/Demand ischemia: Seen by cardiology, demand ischemia from atrial fibrillation and pneumonia.   Right leg pain: Patient's are complaining of right leg/knee pain today. X-rays checked and unrevealing. Patient already on anticoagulation so doubt this is DVT. If this is persisting, we'll check lower extremity Dopplers tomorrow.   Code Status: DO NOT RESUSCITATE  Family Communication: Daughter at the bedside  Disposition Plan: Transfer out of stepdown. Continue in-hospital until heart rate controlled, she is more alert   Consultants:  Cardiology  Procedures:  None  Antibiotics:  IV Rocephin 1/3-present  IV Zithromax 1/3-present   Objective: BP 110/58 mmHg  Pulse 55  Temp(Src) 98.1 F (36.7 C) (Axillary)  Resp 15  Ht 5\' 4"  (1.626 m)  Wt 52.8 kg (116 lb 6.5 oz)  BMI 19.97 kg/m2  SpO2 94%  Intake/Output Summary (Last 24 hours) at 05-30-14 1745 Last data filed at 2014-05-30 1220  Gross per 24 hour  Intake 700.83 ml  Output    400 ml  Net 300.83 ml   Filed Weights   05/18/2014 1822 05/10/2014 2135 May 30, 2014 0500  Weight: 52.164 kg (115 lb) 52.8 kg (116 lb 6.5 oz) 52.8 kg (116 lb 6.5 oz)    Exam:   General:  Confused, easily agitated  Cardiovascular: Irregular rhythm, borderline tachycardia  Respiratory: Decreased breath sounds bibasilar  Abdomen: Soft, nontender, nondistended, hypoactive bowel sounds  Musculoskeletal: No edema, full range of motion, no clubbing or cyanosis   Data Reviewed: Basic Metabolic Panel:  Recent Labs Lab 05/10/14 1847 May 30, 2014 0308  NA 137 136  K 4.3 3.7  CL 106 108  CO2 18* 19  GLUCOSE 165* 117*  BUN 37* 39*  CREATININE 2.20* 2.24*  CALCIUM 9.4 8.3*  MG 1.7  --    Liver Function Tests:  Recent Labs Lab 05/09/2014 0308  AST 54*  ALT 22  ALKPHOS 55  BILITOT 2.9*  PROT 5.4*  ALBUMIN 3.0*   No results for input(s): LIPASE, AMYLASE in the last 168 hours. No results for input(s): AMMONIA in the last 168  hours. CBC:  Recent Labs Lab 2014-05-20 1847 05/08/2014 0308  WBC 10.1 7.6  NEUTROABS 8.6*  --   HGB 15.2* 12.3  HCT 46.5* 36.6  MCV 84.7 84.3  PLT 122* 103*   Cardiac Enzymes:    Recent Labs Lab 05-20-2014 1847 2014-05-20 2129 05/26/2014 0308 05/21/2014 0840  CKTOTAL 932*  --  666*  --   TROPONINI 0.06* 0.06* 0.07* 0.07*   BNP (last 3 results) No results for input(s): PROBNP in the last 8760 hours. CBG: No results for input(s): GLUCAP in the last 168 hours.  Recent Results (from the past 240 hour(s))  Blood culture (routine x 2)     Status: None (Preliminary result)   Collection Time: 20-May-2014  8:07 PM  Result Value Ref Range Status   Specimen Description LEFT ANTECUBITAL  Final   Special Requests BOTTLES DRAWN AEROBIC AND ANAEROBIC 6CC  Final   Culture NO GROWTH 1 DAY  Final   Report Status PENDING  Incomplete  Blood culture (routine x 2)     Status: None (Preliminary result)   Collection Time: 20-May-2014  8:08 PM  Result Value Ref Range Status   Specimen Description BLOOD LEFT ARM  Final   Special Requests BOTTLES DRAWN AEROBIC AND ANAEROBIC 6CC  Final   Culture NO GROWTH 1 DAY  Final   Report Status PENDING  Incomplete  MRSA PCR Screening     Status: None   Collection Time: 05-20-2014  8:50 PM  Result Value Ref Range Status   MRSA by PCR NEGATIVE NEGATIVE Final    Comment:        The GeneXpert MRSA Assay (FDA approved for NASAL specimens only), is one component of a comprehensive MRSA colonization surveillance program. It is not intended to diagnose MRSA infection nor to guide or monitor treatment for MRSA infections.      Studies: Dg Chest 1 View  05-20-14   CLINICAL DATA:  Fall hitting face and inner tendon centered. Found on the floor.  EXAM: CHEST - 1 VIEW  COMPARISON:  One-view chest 04/23/2013.  FINDINGS: The heart is enlarged. The patient is status post median sternotomy for CABG. Pacing wires are stable in position. A new left pleural effusion is present.  Left basilar airspace disease is noted as well. The right lung is clear. Mild pulmonary vascular congestion is noted. Scarring at the lung apices is stable.  IMPRESSION: 1. New left pleural effusion and associated airspace disease. This is most concerning for infection. 2. Asymmetric pulmonary vascular congestion on the left. An element of congestive heart failure is also considered. 3. The right lung is clear.   Electronically Signed   By: Gennette Pac M.D.   On: May 20, 2014 19:19   Ct Head Wo Contrast  May 20, 2014   CLINICAL DATA:  Facial injury after multiple falls at home today.  EXAM: CT HEAD WITHOUT CONTRAST  CT MAXILLOFACIAL WITHOUT CONTRAST  CT CERVICAL SPINE WITHOUT CONTRAST  TECHNIQUE: Multidetector CT imaging of the head, cervical spine, and maxillofacial structures were performed using the standard protocol without intravenous contrast. Multiplanar CT image reconstructions of the cervical spine and maxillofacial structures were also generated.  COMPARISON:  CT  scan of head of April 07, 2013.  FINDINGS: CT HEAD FINDINGS  Bony calvarium appears intact. Mild diffuse cortical atrophy is noted. Mild chronic ischemic white matter disease is noted. No mass effect or midline shift is noted. Ventricular size is within normal limits. There is no evidence of mass lesion, hemorrhage or acute infarction.  CT MAXILLOFACIAL FINDINGS  Mildly depressed nasal bone fracture is noted. Paranasal sinuses appear normal. Pterygoid plates appear normal. No other fracture is noted. Ostiomeatal complexes are widely patent. Globes and orbits appear normal.  CT CERVICAL SPINE FINDINGS  No fracture or spondylolisthesis is noted. Mild hypertrophy of posterior facet joints is noted. Degenerative disc disease is noted at C6-7 and C7-T1 with anterior osteophyte formation.  IMPRESSION: Mild diffuse cortical atrophy. Mild chronic ischemic white matter disease. No acute intracranial abnormality seen.  Mildly depressed nasal bone  fracture is noted.  Degenerative changes as described above in the cervical spine. No acute fracture or spondylolisthesis is noted in the cervical spine.   Electronically Signed   By: Roque Lias M.D.   On: 05/30/2014 19:42   Ct Cervical Spine Wo Contrast  05/16/2014   CLINICAL DATA:  Facial injury after multiple falls at home today.  EXAM: CT HEAD WITHOUT CONTRAST  CT MAXILLOFACIAL WITHOUT CONTRAST  CT CERVICAL SPINE WITHOUT CONTRAST  TECHNIQUE: Multidetector CT imaging of the head, cervical spine, and maxillofacial structures were performed using the standard protocol without intravenous contrast. Multiplanar CT image reconstructions of the cervical spine and maxillofacial structures were also generated.  COMPARISON:  CT scan of head of April 07, 2013.  FINDINGS: CT HEAD FINDINGS  Bony calvarium appears intact. Mild diffuse cortical atrophy is noted. Mild chronic ischemic white matter disease is noted. No mass effect or midline shift is noted. Ventricular size is within normal limits. There is no evidence of mass lesion, hemorrhage or acute infarction.  CT MAXILLOFACIAL FINDINGS  Mildly depressed nasal bone fracture is noted. Paranasal sinuses appear normal. Pterygoid plates appear normal. No other fracture is noted. Ostiomeatal complexes are widely patent. Globes and orbits appear normal.  CT CERVICAL SPINE FINDINGS  No fracture or spondylolisthesis is noted. Mild hypertrophy of posterior facet joints is noted. Degenerative disc disease is noted at C6-7 and C7-T1 with anterior osteophyte formation.  IMPRESSION: Mild diffuse cortical atrophy. Mild chronic ischemic white matter disease. No acute intracranial abnormality seen.  Mildly depressed nasal bone fracture is noted.  Degenerative changes as described above in the cervical spine. No acute fracture or spondylolisthesis is noted in the cervical spine.   Electronically Signed   By: Roque Lias M.D.   On: 05/27/2014 19:42   Ct Maxillofacial Wo  Cm  05/21/2014   CLINICAL DATA:  Facial injury after multiple falls at home today.  EXAM: CT HEAD WITHOUT CONTRAST  CT MAXILLOFACIAL WITHOUT CONTRAST  CT CERVICAL SPINE WITHOUT CONTRAST  TECHNIQUE: Multidetector CT imaging of the head, cervical spine, and maxillofacial structures were performed using the standard protocol without intravenous contrast. Multiplanar CT image reconstructions of the cervical spine and maxillofacial structures were also generated.  COMPARISON:  CT scan of head of April 07, 2013.  FINDINGS: CT HEAD FINDINGS  Bony calvarium appears intact. Mild diffuse cortical atrophy is noted. Mild chronic ischemic white matter disease is noted. No mass effect or midline shift is noted. Ventricular size is within normal limits. There is no evidence of mass lesion, hemorrhage or acute infarction.  CT MAXILLOFACIAL FINDINGS  Mildly depressed nasal bone fracture is noted. Paranasal  sinuses appear normal. Pterygoid plates appear normal. No other fracture is noted. Ostiomeatal complexes are widely patent. Globes and orbits appear normal.  CT CERVICAL SPINE FINDINGS  No fracture or spondylolisthesis is noted. Mild hypertrophy of posterior facet joints is noted. Degenerative disc disease is noted at C6-7 and C7-T1 with anterior osteophyte formation.  IMPRESSION: Mild diffuse cortical atrophy. Mild chronic ischemic white matter disease. No acute intracranial abnormality seen.  Mildly depressed nasal bone fracture is noted.  Degenerative changes as described above in the cervical spine. No acute fracture or spondylolisthesis is noted in the cervical spine.   Electronically Signed   By: Roque Lias M.D.   On: 05/13/2014 19:42    Scheduled Meds: . antiseptic oral rinse  7 mL Mouth Rinse BID  . apixaban  2.5 mg Oral BID  . azithromycin  500 mg Intravenous Q24H  . cefTRIAXone (ROCEPHIN)  IV  1 g Intravenous Q24H  . feeding supplement (ENSURE COMPLETE)  237 mL Oral BID BM  . metoprolol tartrate  25 mg Oral  TID  . raloxifene  60 mg Oral Daily  . sodium chloride  3 mL Intravenous Q12H  . sodium chloride  3 mL Intravenous Q12H    Continuous Infusions: . sodium chloride 50 mL/hr at 05/08/2014 1200  . diltiazem (CARDIZEM) infusion 5 mg/hr (13-May-2014 1916)     Time spent: 35 minutes  Hollice Espy  [facility] Pager [phone]. If 7PM-7AM, please contact night-coverage at [email or web address], password [facility] 05/27/2014, 5:45 PM  LOS: 1 day

## 2014-06-08 NOTE — Plan of Care (Signed)
Problem: Consults Goal: General Medical Patient Education See Patient Education Module for specific education. Admitted  With fall, bruising to upper body and right lateral hip.  Down for several hours, found by family Goal: Skin Care Protocol Initiated - if Braden Score 18 or less If consults are not indicated, leave blank or document N/A Outcome: Progressing Monitoring bruising  Goal: Nutrition Consult-if indicated Outcome: Progressing Supplemental ensure drinks   Problem: Phase I Progression Outcomes Goal: Pain controlled with appropriate interventions Outcome: Progressing Osteoarthritis and leg cramps tonight, tylenol  given po Goal: OOB as tolerated unless otherwise ordered Outcome: Progressing Stood at bedside with 2 person assist due to leg cramps, attempting to find relief, patient became fearful of falling, tearful Goal: Hemodynamically stable Outcome: Not Progressing Patient admitted in a fultter, in and out of ventricular rhythm. Pacemaker not capturing at times,

## 2014-06-08 NOTE — Progress Notes (Signed)
Nutrition Brief Note  Patient identified on the Malnutrition Screening Tool (MST) Report  Wt Readings from Last 15 Encounters:  06/03/2014 116 lb 6.5 oz (52.8 kg)  08/15/13 115 lb 12.8 oz (52.527 kg)  06/18/13 109 lb (49.442 kg)  05/13/13 107 lb (48.535 kg)  04/23/13 131 lb 6.3 oz (59.6 kg)  04/17/13 128 lb (58.06 kg)  04/10/13 123 lb 3.8 oz (55.9 kg)    Body mass index is 19.97 kg/(m^2). Patient meets criteria for normal based on current BMI. Her weight hx shows gain over the past year of 7# which is desirable due to previous weight loss in late 2014.  Current diet order is heart healthy  patient is consuming approximately 50% of meals at this time. Labs and medications reviewed.   No nutrition interventions warranted at this time. If nutrition issues arise, please consult RD.   Royann Shivers MS,RD,CSG,LDN Office: [phone] Pager: [phone]

## 2014-06-08 DEATH — deceased

## 2014-06-11 NOTE — Discharge Summary (Addendum)
Death Summary  Brittany DandyVirginia T Hickman JXB:[id number] DOB: [birth year] DOA: 06/07/2014  PCP: Selinda FlavinHOWARD, KEVIN, MD  Admit date: 05/31/2014 Date of Death: 06/03/2014  Final Diagnoses:  Principal Problem:   Altered mental status Active Problems:   HTN (hypertension)   Atrial fibrillation   CKD (chronic kidney disease), stage III   Fall   CAP (community acquired pneumonia)   Hypothermia   Nasal fracture   Non-traumatic rhabdomyolysis   Atrial flutter, unspecified   Demand ischemia   Right leg pain  acute congestive heart failure Multifactorial toxic metabolic encephalopathy  History of present illness:  81 year old female who lives alone with past medical history of atrial fibrillation and chronic kidney disease who was brought in on 1/3 after her daughter found her on the floor sitting upright with bruising to her nose and eyes. The patient was unable to relay what happened and she apparently fallen, but it was unclear when that happened. Initially the daughter stayed with the patient and patient seemed to improve, but then had a syncopal episode and fell again. She was then brought into the emergency room where it was noted that she was in rapid atrial fibrillation, somewhat hypothermic, mild acute renal failure and had a left sided pneumonia. Hospitalists were called for further evaluation and admission  Hospital Course:  Patient placed in stepdown unit. Started on IV fluids, IV antibiotics and Cardizem drip. Cardiology consulted. Patient continued on anticoagulation as recommended by cardiology given high risk for CVA. Patient's altered mental status was felt to be multifactorial toxic encephalopathy from pneumonia, possible concussion, hypothermia and atrial fibrillation. Initial CT scan unrevealing. Patient also noted to be in mild rhabdomyolysis. Unable to get MRI because of history of pacemaker. Initial troponins were felt to be secondary to pneumonia, but also some demand ischemia from atrial  fibrillation.  Initial plans were to continue antibiotics, once heart rate stabilized change over to oral medications and then recheck CT scan in 1-2 days to confirm no CVA. Cardiology saw patient and felt that she was in acute CHF, , however echocardiogram not performed prior to death.  That evening, patient had further change in mental status and unable and unwilling to take by mouth metoprolol or eliquis. Patient changed IV medications only. Patient noted to be drowsy. Nursing then reported that patient grabbed her chest and grimacing. Breathing became agonal and patient expired at 11:33 PM.   Time: 25 minutes  Signed:  Hollice EspyKRISHNAN,Priyana Mccarey K  [facility] 06/11/2014, 5:09 PM

## 2014-06-26 ENCOUNTER — Encounter: Payer: Medicare Other | Admitting: Internal Medicine

## 2015-01-31 IMAGING — CR DG CHEST 2V
2 series · 2 of 2 positions shown · non-contrast
Comparison: Prior radiograph from 04/07/2013

CLINICAL DATA: Post pacemaker insertion

EXAM:
CHEST  2 VIEW

[x chest ap]
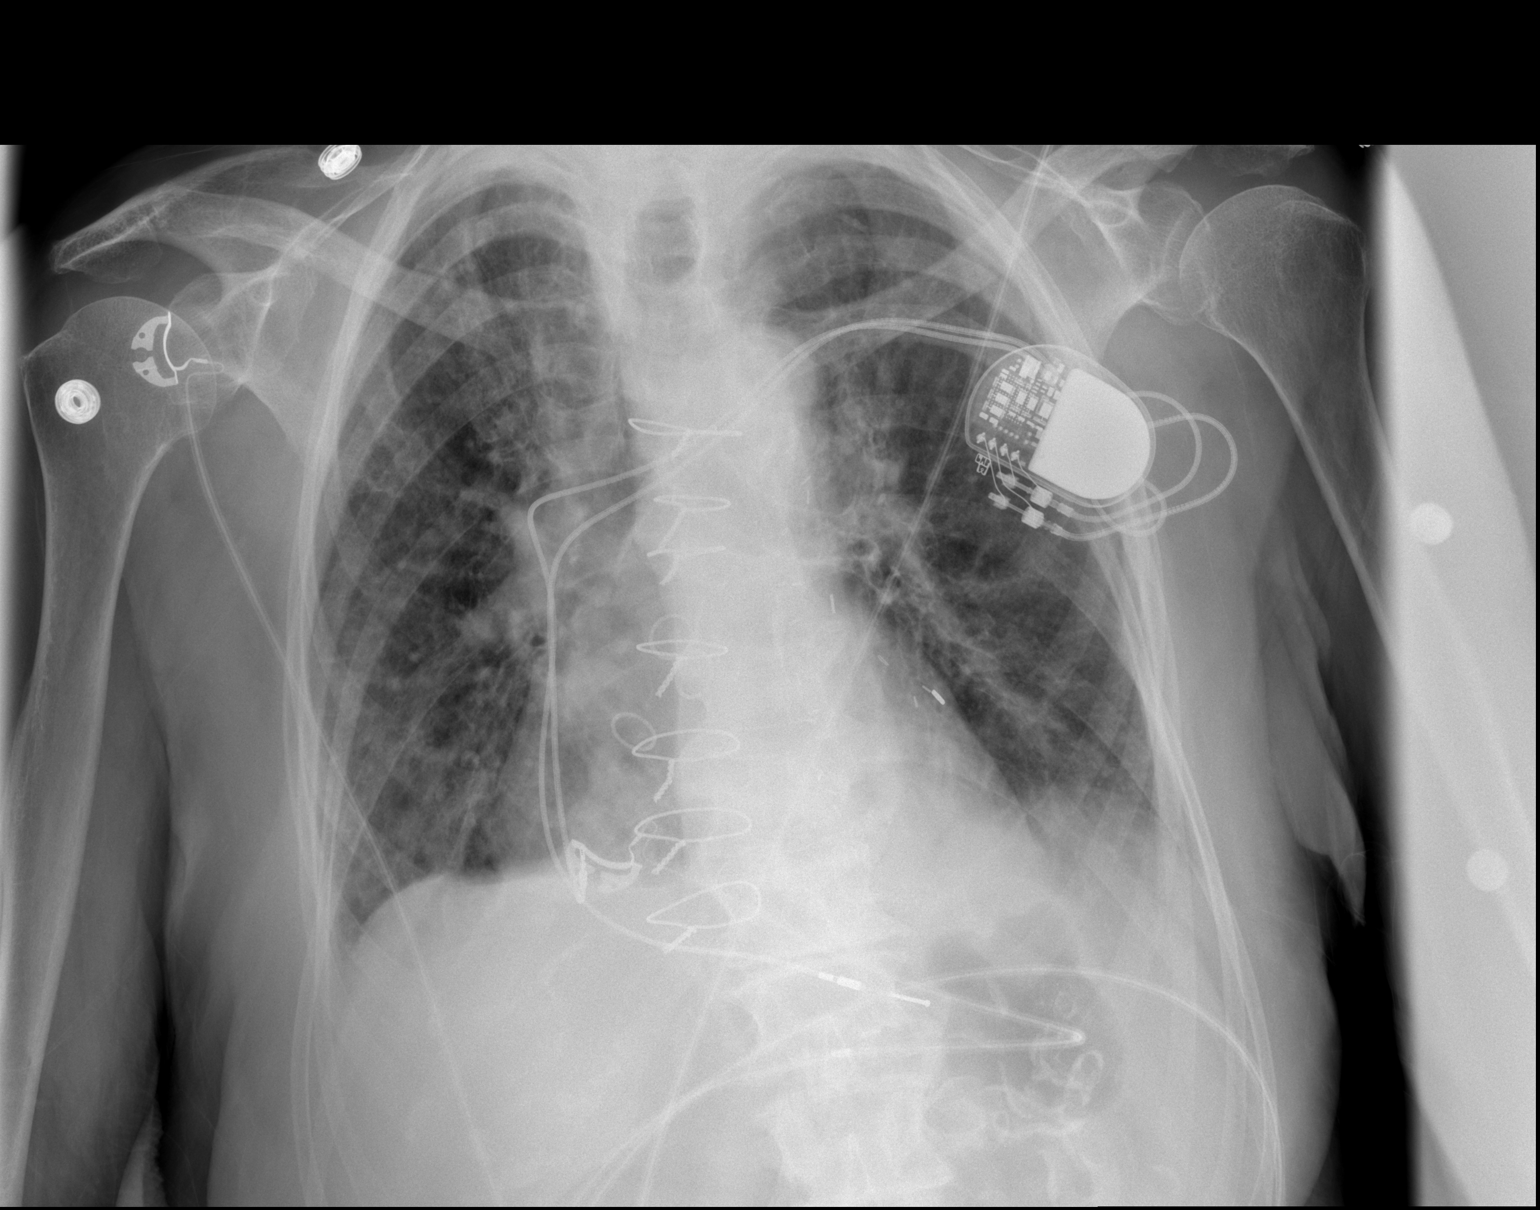

[w chest lat]
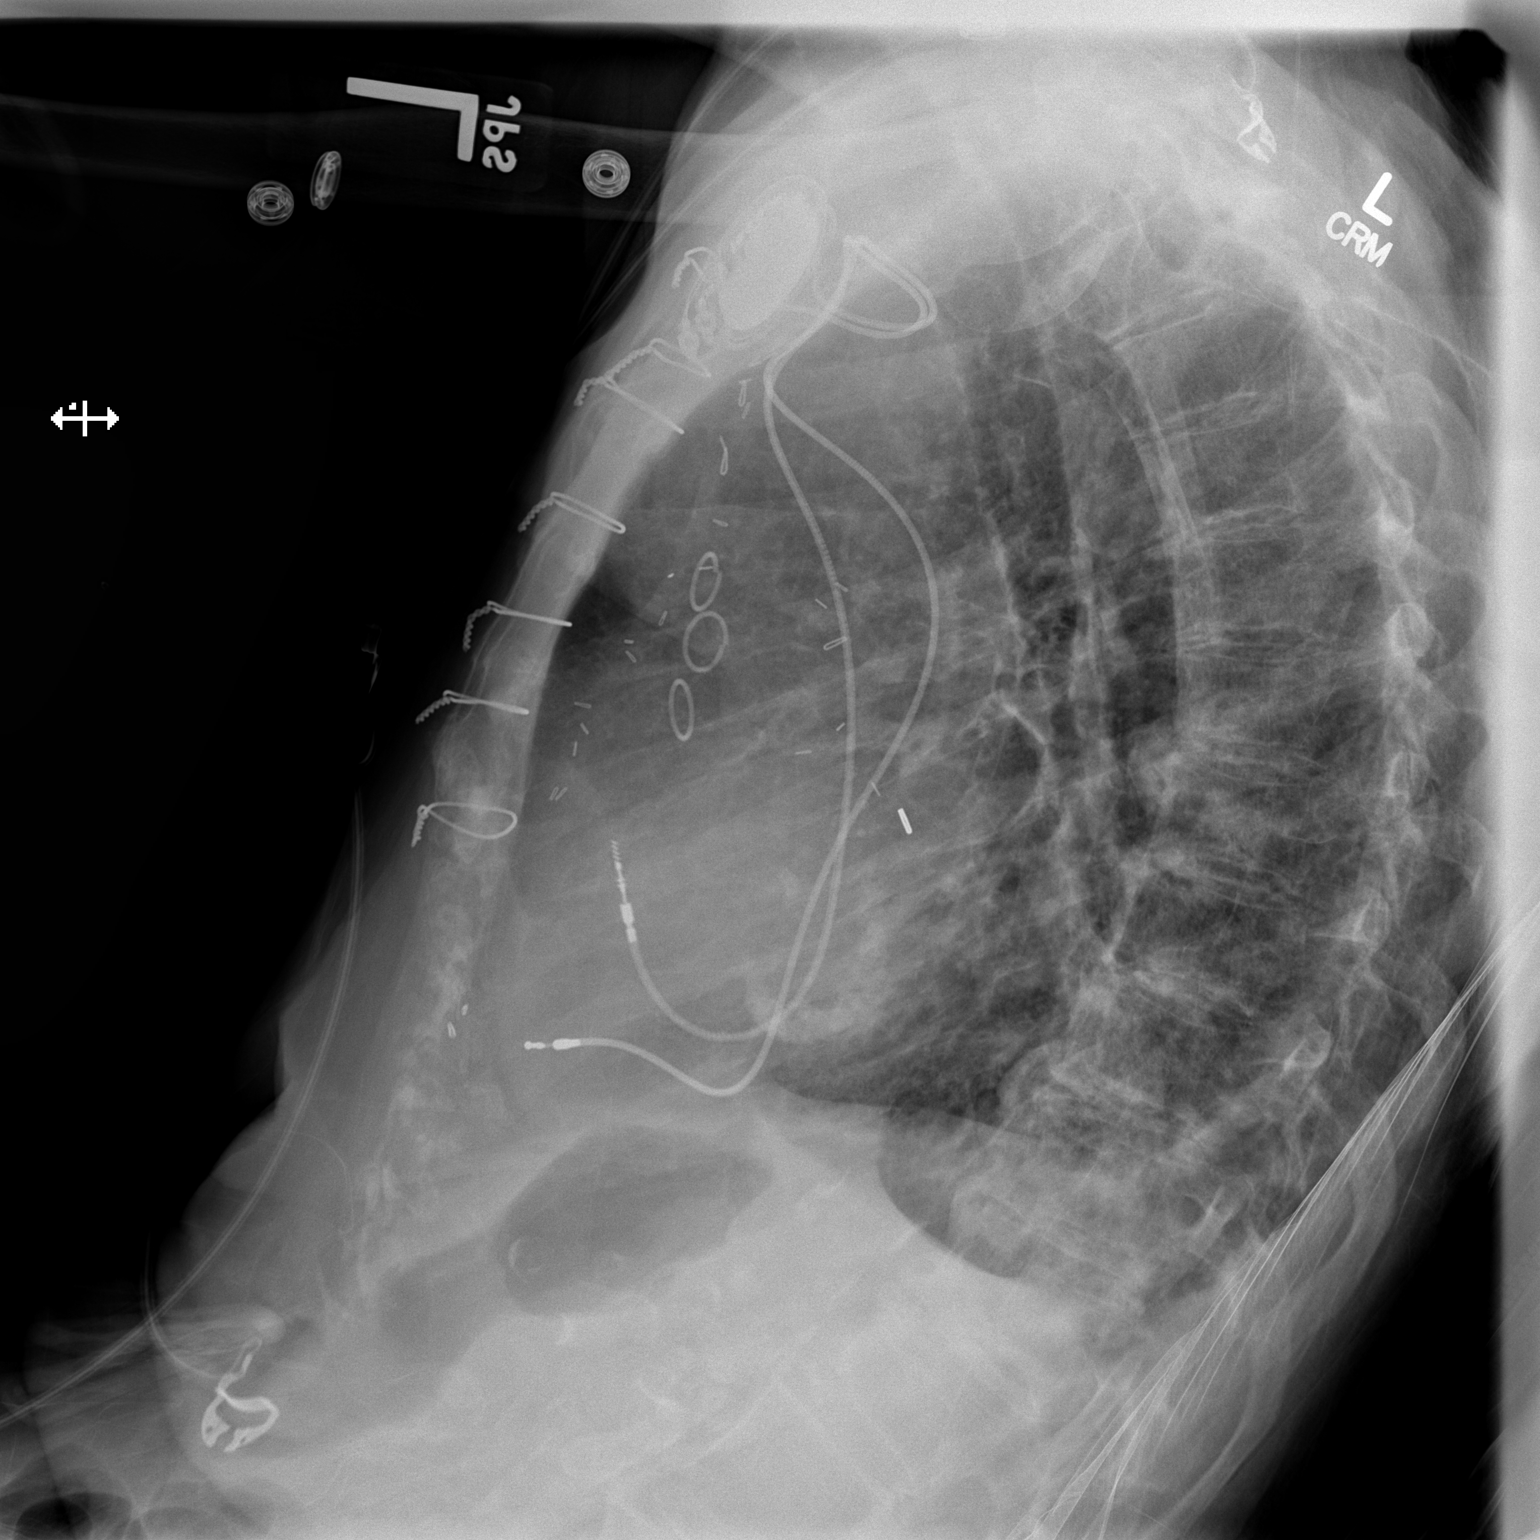

[2 of 2 positions shown; findings below may reference images not displayed]

FINDINGS: There has been interval placement of a left-sided dual lead
transvenous pacemaker/AICD with electrodes overlying the right
atrium and right ventricle. Sequelae of prior CABG again noted.
Transverse heart size is stable in size and contour, and remain is
within normal limits.

Lungs are mildly hypoinflated. There has been interval development
of diffuse pulmonary vascular congestion, new as compared to prior.
Small bilateral pleural effusions are suspected. No pneumothorax
identified.

Osseous structures are unchanged.
IMPRESSION: 1. Interval placement of dual lead left-sided transvenous
pacemaker/AICD without complication. No pneumothorax.
2. Interval development of mild diffuse pulmonary vascular
congestion with small bilateral pleural effusions.

## 2015-02-14 IMAGING — CR DG CHEST 1V PORT
1 series · 1 of 1 positions shown · non-contrast
Comparison: 04/09/2013.

CLINICAL DATA: Shortness of breath, worsening.

EXAM:
PORTABLE CHEST - 1 VIEW

[portable]
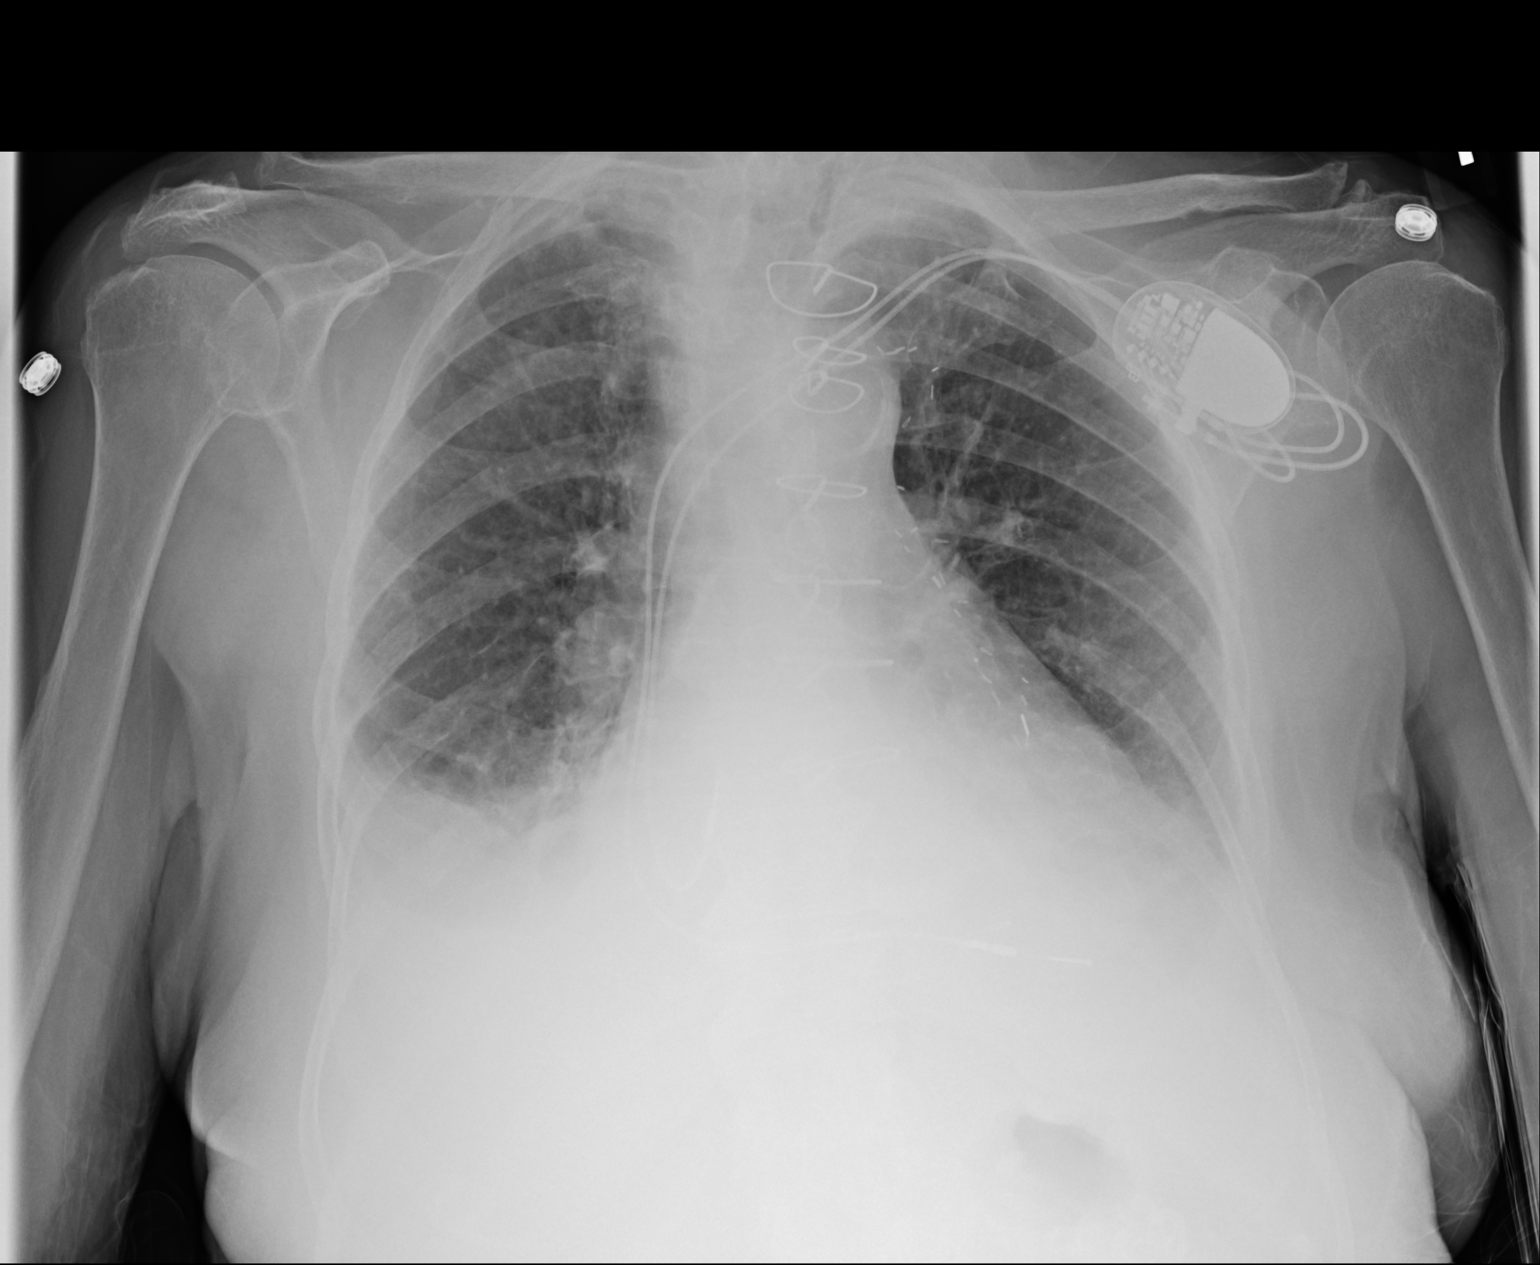

[1 of 1 positions shown; findings below may reference images not displayed]

FINDINGS: Trachea is midline. Heart is enlarged, stable. Pacemaker lead tips
project over the right atrium and right ventricle. Thoracic aorta is
calcified.

Biapical pleural parenchymal scarring. Bilateral pleural effusions
with mild bibasilar airspace disease.
IMPRESSION: Enlarging bilateral pleural effusions with mild bibasilar airspace
disease.
